# Patient Record
Sex: Male | Born: 1960
Health system: Southern US, Community
[De-identification: ages and names within clinical notes are randomized; demographics above are authoritative.]

## PROBLEM LIST (undated history)

## (undated) DIAGNOSIS — F32A Depression, unspecified: Secondary | ICD-10-CM

## (undated) DIAGNOSIS — E291 Testicular hypofunction: Secondary | ICD-10-CM

## (undated) DIAGNOSIS — F419 Anxiety disorder, unspecified: Secondary | ICD-10-CM

## (undated) DIAGNOSIS — N401 Enlarged prostate with lower urinary tract symptoms: Secondary | ICD-10-CM

## (undated) DIAGNOSIS — R3912 Poor urinary stream: Secondary | ICD-10-CM

## (undated) DIAGNOSIS — R1011 Right upper quadrant pain: Secondary | ICD-10-CM

## (undated) DIAGNOSIS — IMO0001 Reserved for inherently not codable concepts without codable children: Secondary | ICD-10-CM

## (undated) DIAGNOSIS — N138 Other obstructive and reflux uropathy: Secondary | ICD-10-CM

## (undated) DIAGNOSIS — E039 Hypothyroidism, unspecified: Secondary | ICD-10-CM

## (undated) DIAGNOSIS — M549 Dorsalgia, unspecified: Secondary | ICD-10-CM

## (undated) DIAGNOSIS — N281 Cyst of kidney, acquired: Secondary | ICD-10-CM

## (undated) DIAGNOSIS — Z0000 Encounter for general adult medical examination without abnormal findings: Secondary | ICD-10-CM

## (undated) DIAGNOSIS — F329 Major depressive disorder, single episode, unspecified: Secondary | ICD-10-CM

## (undated) DIAGNOSIS — E663 Overweight: Secondary | ICD-10-CM

## (undated) DIAGNOSIS — K219 Gastro-esophageal reflux disease without esophagitis: Secondary | ICD-10-CM

## (undated) DIAGNOSIS — G4730 Sleep apnea, unspecified: Secondary | ICD-10-CM

## (undated) DIAGNOSIS — N400 Enlarged prostate without lower urinary tract symptoms: Secondary | ICD-10-CM

## (undated) DIAGNOSIS — N2889 Other specified disorders of kidney and ureter: Secondary | ICD-10-CM

## (undated) HISTORY — DX: Anxiety disorder, unspecified: F41.9

## (undated) HISTORY — DX: Depression, unspecified: F32.A

## (undated) HISTORY — DX: Right upper quadrant pain: R10.11

## (undated) HISTORY — DX: Benign prostatic hyperplasia without lower urinary tract symptoms: N40.0

## (undated) HISTORY — DX: Overweight: E66.3

## (undated) HISTORY — DX: Poor urinary stream: R39.12

## (undated) HISTORY — DX: Hypothyroidism, unspecified: E03.9

## (undated) HISTORY — DX: Benign prostatic hyperplasia with lower urinary tract symptoms: N40.1

## (undated) HISTORY — DX: Reserved for inherently not codable concepts without codable children: IMO0001

## (undated) HISTORY — DX: Cyst of kidney, acquired: N28.1

## (undated) HISTORY — DX: Other obstructive and reflux uropathy: N13.8

## (undated) HISTORY — DX: Major depressive disorder, single episode, unspecified: F32.9

## (undated) HISTORY — DX: Gastro-esophageal reflux disease without esophagitis: K21.9

## (undated) HISTORY — DX: Testicular hypofunction: E29.1

## (undated) HISTORY — DX: Encounter for general adult medical examination without abnormal findings: Z00.00

## (undated) HISTORY — DX: Other specified disorders of kidney and ureter: N28.89

## (undated) HISTORY — DX: Sleep apnea, unspecified: G47.30

## (undated) HISTORY — DX: Dorsalgia, unspecified: M54.9

---

## 2004-09-25 ENCOUNTER — Ambulatory Visit: Payer: Self-pay | Admitting: Family Medicine

## 2005-01-28 ENCOUNTER — Ambulatory Visit: Payer: Self-pay | Admitting: Vascular Surgery

## 2008-02-24 ENCOUNTER — Emergency Department: Payer: Self-pay | Admitting: Emergency Medicine

## 2008-02-24 ENCOUNTER — Other Ambulatory Visit: Payer: Self-pay

## 2008-06-27 DIAGNOSIS — D239 Other benign neoplasm of skin, unspecified: Secondary | ICD-10-CM

## 2008-06-27 HISTORY — DX: Other benign neoplasm of skin, unspecified: D23.9

## 2009-04-29 ENCOUNTER — Ambulatory Visit: Payer: Self-pay | Admitting: General Practice

## 2012-07-19 HISTORY — PX: COLONOSCOPY: SHX174

## 2013-04-17 ENCOUNTER — Ambulatory Visit: Payer: Self-pay | Admitting: Family Medicine

## 2013-05-28 ENCOUNTER — Ambulatory Visit: Payer: Self-pay | Admitting: Gastroenterology

## 2013-08-13 ENCOUNTER — Emergency Department: Payer: Self-pay | Admitting: Emergency Medicine

## 2013-08-13 LAB — URINALYSIS, COMPLETE
BILIRUBIN, UR: NEGATIVE
Bacteria: NONE SEEN
Glucose,UR: NEGATIVE mg/dL (ref 0–75)
Ketone: NEGATIVE
Leukocyte Esterase: NEGATIVE
Nitrite: NEGATIVE
PH: 5 (ref 4.5–8.0)
PROTEIN: NEGATIVE
SQUAMOUS EPITHELIAL: NONE SEEN
Specific Gravity: 1.011 (ref 1.003–1.030)

## 2013-08-13 LAB — COMPREHENSIVE METABOLIC PANEL
ANION GAP: 3 — AB (ref 7–16)
Albumin: 4.2 g/dL (ref 3.4–5.0)
Alkaline Phosphatase: 102 U/L
BUN: 15 mg/dL (ref 7–18)
Bilirubin,Total: 0.5 mg/dL (ref 0.2–1.0)
CALCIUM: 9.2 mg/dL (ref 8.5–10.1)
CO2: 28 mmol/L (ref 21–32)
Chloride: 104 mmol/L (ref 98–107)
Creatinine: 1.08 mg/dL (ref 0.60–1.30)
EGFR (African American): >60
EGFR (Non-African Amer.): >60
Glucose: 98 mg/dL (ref 65–99)
OSMOLALITY: 271 (ref 275–301)
POTASSIUM: 4.1 mmol/L (ref 3.5–5.1)
SGOT(AST): 28 U/L (ref 15–37)
SGPT (ALT): 33 U/L (ref 12–78)
Sodium: 135 mmol/L — ABNORMAL LOW (ref 136–145)
Total Protein: 7.6 g/dL (ref 6.4–8.2)

## 2013-08-13 LAB — CBC WITH DIFFERENTIAL/PLATELET
BASOS ABS: 0 10*3/uL (ref 0.0–0.1)
Basophil %: 0.4 %
EOS ABS: 0.3 10*3/uL (ref 0.0–0.7)
Eosinophil %: 3.1 %
HCT: 44.4 % (ref 40.0–52.0)
HGB: 14.7 g/dL (ref 13.0–18.0)
LYMPHS PCT: 34 %
Lymphocyte #: 3.2 10*3/uL (ref 1.0–3.6)
MCH: 29.8 pg (ref 26.0–34.0)
MCHC: 33.2 g/dL (ref 32.0–36.0)
MCV: 90 fL (ref 80–100)
MONO ABS: 0.7 x10 3/mm (ref 0.2–1.0)
Monocyte %: 7.6 %
Neutrophil #: 5.1 10*3/uL (ref 1.4–6.5)
Neutrophil %: 54.9 %
Platelet: 237 10*3/uL (ref 150–440)
RBC: 4.95 10*6/uL (ref 4.40–5.90)
RDW: 13.3 % (ref 11.5–14.5)
WBC: 9.3 10*3/uL (ref 3.8–10.6)

## 2013-08-13 LAB — LIPASE, BLOOD: LIPASE: 141 U/L (ref 73–393)

## 2013-08-29 ENCOUNTER — Ambulatory Visit: Payer: Self-pay | Admitting: Gastroenterology

## 2013-08-29 LAB — KOH PREP

## 2013-08-31 LAB — PATHOLOGY REPORT

## 2013-09-17 DIAGNOSIS — R1011 Right upper quadrant pain: Secondary | ICD-10-CM | POA: Insufficient documentation

## 2013-09-17 DIAGNOSIS — N281 Cyst of kidney, acquired: Secondary | ICD-10-CM

## 2013-09-17 DIAGNOSIS — N2889 Other specified disorders of kidney and ureter: Secondary | ICD-10-CM

## 2013-09-17 HISTORY — DX: Other specified disorders of kidney and ureter: N28.89

## 2013-09-17 HISTORY — DX: Right upper quadrant pain: R10.11

## 2013-09-17 HISTORY — DX: Cyst of kidney, acquired: N28.1

## 2013-10-18 ENCOUNTER — Ambulatory Visit: Payer: Self-pay | Admitting: Urology

## 2014-01-24 ENCOUNTER — Ambulatory Visit: Payer: Self-pay

## 2014-05-27 ENCOUNTER — Ambulatory Visit: Payer: Self-pay

## 2014-05-27 LAB — URINALYSIS, COMPLETE
Bacteria: NEGATIVE
Bilirubin,UR: NEGATIVE
Blood: NEGATIVE
GLUCOSE, UR: NEGATIVE
Ketone: NEGATIVE
Leukocyte Esterase: NEGATIVE
Nitrite: NEGATIVE
PROTEIN: NEGATIVE
Ph: 6.5 (ref 5.0–8.0)
Specific Gravity: 1.01 (ref 1.000–1.030)
WBC UR: NONE SEEN /HPF (ref 0–5)

## 2015-02-14 ENCOUNTER — Other Ambulatory Visit: Payer: Self-pay | Admitting: Urology

## 2015-02-21 ENCOUNTER — Other Ambulatory Visit: Payer: Self-pay | Admitting: Family Medicine

## 2015-02-21 ENCOUNTER — Other Ambulatory Visit: Payer: Self-pay | Admitting: Urology

## 2015-02-21 DIAGNOSIS — R972 Elevated prostate specific antigen [PSA]: Secondary | ICD-10-CM

## 2015-02-21 DIAGNOSIS — E291 Testicular hypofunction: Secondary | ICD-10-CM

## 2015-02-21 NOTE — Telephone Encounter (Signed)
See below

## 2015-02-21 NOTE — Telephone Encounter (Signed)
Routing to provider  

## 2015-02-21 NOTE — Telephone Encounter (Signed)
Patient needs recheck in September. Will not refill now unless he's out. Year supply given in September

## 2015-02-21 NOTE — Telephone Encounter (Signed)
Pt called stating he needs refill on his thyroid medication sent to [facility] on [address]. Thanks.

## 2015-02-24 MED ORDER — LEVOTHYROXINE SODIUM 88 MCG PO TABS: 88.0000 ug | ORAL_TABLET | Freq: Every day | ORAL | Status: DC

## 2015-02-24 NOTE — Telephone Encounter (Signed)
Left message for patient

## 2015-03-03 NOTE — Addendum Note (Signed)
Addended by: Toniann Fail C on: 03/03/2015 11:00 AM   Modules accepted: Orders

## 2015-03-03 NOTE — Telephone Encounter (Signed)
Spoke with pt in reference to testosterone therapy. Made pt aware for the need of labs and office visit prior to getting medication refilled. Pt voiced understanding. Pt was transferred to the front to make appts.

## 2015-03-04 ENCOUNTER — Other Ambulatory Visit: Payer: BLUE CROSS/BLUE SHIELD

## 2015-03-04 DIAGNOSIS — E291 Testicular hypofunction: Secondary | ICD-10-CM

## 2015-03-04 DIAGNOSIS — R972 Elevated prostate specific antigen [PSA]: Secondary | ICD-10-CM

## 2015-03-05 ENCOUNTER — Encounter: Payer: Self-pay | Admitting: *Deleted

## 2015-03-05 ENCOUNTER — Other Ambulatory Visit: Payer: Self-pay | Admitting: *Deleted

## 2015-03-05 LAB — PSA: Prostate Specific Ag, Serum: 0.3 ng/mL (ref 0.0–4.0)

## 2015-03-05 LAB — HEMATOCRIT: Hematocrit: 45.3 % (ref 37.5–51.0)

## 2015-03-05 LAB — TESTOSTERONE: TESTOSTERONE: 195 ng/dL — AB (ref 348–1197)

## 2015-03-13 ENCOUNTER — Ambulatory Visit (INDEPENDENT_AMBULATORY_CARE_PROVIDER_SITE_OTHER): Payer: BLUE CROSS/BLUE SHIELD | Admitting: Urology

## 2015-03-13 VITALS — BP 117/78 | HR 78 | Resp 18 | Ht 70.0 in | Wt 212.9 lb

## 2015-03-13 DIAGNOSIS — N400 Enlarged prostate without lower urinary tract symptoms: Secondary | ICD-10-CM | POA: Diagnosis not present

## 2015-03-13 DIAGNOSIS — E291 Testicular hypofunction: Secondary | ICD-10-CM

## 2015-03-13 DIAGNOSIS — N401 Enlarged prostate with lower urinary tract symptoms: Secondary | ICD-10-CM

## 2015-03-13 DIAGNOSIS — N138 Other obstructive and reflux uropathy: Secondary | ICD-10-CM

## 2015-03-13 LAB — BLADDER SCAN AMB NON-IMAGING

## 2015-03-13 MED ORDER — TESTOSTERONE 20.25 MG/1.25GM (1.62%) TD GEL: 2.0000 | Freq: Every day | TRANSDERMAL | Status: DC

## 2015-03-13 NOTE — Progress Notes (Signed)
03/13/2015 4:14 PM   Frank [name]. 1961/02/02 [id number]  Referring provider: No referring provider defined for this encounter.  Chief Complaint  Patient presents with  . Follow-up  . Hypogonadism    HPI: Frank [name] is a 54 year old white male with hypogonadism and BPH with LUTS who presents today for follow-up  Hypogonadism Patient is currently managing his hypogonadism with AndroGel 1.62% 2 pumps daily. He is satisfied with this treatment regimen. His pretreatment testosterone level was 125 ng/dL.      Androgen Deficiency in the Aging Male      03/13/15 1500       Androgen Deficiency in the Aging Male   Do you have a decrease in libido (sex drive) No     Do you have lack of energy Yes     Do you have a decrease in strength and/or endurance Yes     Have you lost height No     Have you noticed a decreased "enjoyment of life" Yes     Are you sad and/or grumpy No     Are your erections less strong Yes     Have you noticed a recent deterioration in your ability to play sports Yes     Are you falling asleep after dinner No     Has there been a recent deterioration in your work performance Yes        BPH WITH LUTS His IPSS score today is 7, which is mild lower urinary tract symptomatology. He is mostly satisfied with his quality life due to his urinary symptoms. His PVR is 26 mL.    His major complaint today nocturia x 2.  He has had these symptoms for many years.  It is not bothersome to him at this time.  He denies any dysuria, hematuria or suprapubic pain.   He currently taking tamsulosin.  Previous PSA's:       0.2 ng/mL on 05/30/2014  He also denies any recent fevers, chills, nausea or vomiting.  He does not have a family history of PCa.      IPSS      03/13/15 1500       International Prostate Symptom Score   How often have you had the sensation of not emptying your bladder? Less than 1 in 5     How often have you had to urinate less than every  two hours? Less than 1 in 5 times     How often have you found you stopped and started again several times when you urinated? Less than 1 in 5 times     How often have you found it difficult to postpone urination? Less than 1 in 5 times     How often have you had a weak urinary stream? Less than 1 in 5 times     How often have you had to strain to start urination? Not at All     How many times did you typically get up at night to urinate? 2 Times     Total IPSS Score 7     Quality of Life due to urinary symptoms   If you were to spend the rest of your life with your urinary condition just the way it is now how would you feel about that? Mostly Satisfied        Score:  1-7 Mild 8-19 Moderate 20-35 Severe     PMH: Past Medical History  Diagnosis Date  . Hypothyroidism   .  Testicular hypofunction   . Sleep apnea   . Acid reflux   . Anxiety and depression   . Hypogonadism in male   . Weak urinary stream   . BPH (benign prostatic hyperplasia)   . Over weight   . Frequency     Surgical History: No past surgical history on file.  Home Medications:    Medication List       This list is accurate as of: 03/13/15 11:59 PM.  Always use your most recent med list.               levothyroxine 88 MCG tablet  Commonly known as:  SYNTHROID, LEVOTHROID  Take 1 tablet (88 mcg total) by mouth daily before breakfast. Due for labs around Sept 10th     sertraline 100 MG tablet  Commonly known as:  ZOLOFT  Take 50 mg by mouth.     tamsulosin 0.4 MG Caps capsule  Commonly known as:  FLOMAX  Take 0.4 mg by mouth daily.     Testosterone 20.25 MG/1.25GM (1.62%) Gel  Commonly known as:  ANDROGEL  Apply 2 Act topically daily.        Allergies: No Known Allergies  Family History: Family History  Problem Relation Age of Onset  . Heart disease      uncle  . Congestive Heart Failure Maternal Grandmother   . Cancer Paternal Grandmother     throat  . Skin cancer Maternal  Grandfather     Social History:  reports that he has never smoked. He does not have any smokeless tobacco history on file. He reports that he does not drink alcohol. His drug history is not on file.  ROS: UROLOGY Frequent Urination?: No Hard to postpone urination?: No Burning/pain with urination?: No Get up at night to urinate?: No Leakage of urine?: No Urine stream starts and stops?: No Trouble starting stream?: No Do you have to strain to urinate?: No Blood in urine?: No Urinary tract infection?: No Sexually transmitted disease?: No Injury to kidneys or bladder?: No Painful intercourse?: No Weak stream?: No Erection problems?: No Penile pain?: No  Gastrointestinal Nausea?: No Vomiting?: No Indigestion/heartburn?: No Diarrhea?: No Constipation?: No  Constitutional Fever: No Night sweats?: No Weight loss?: No Fatigue?: No  Skin Skin rash/lesions?: No Itching?: No  Eyes Blurred vision?: No Double vision?: No  Ears/Nose/Throat Sore throat?: No Sinus problems?: No  Hematologic/Lymphatic Swollen glands?: No Easy bruising?: No  Cardiovascular Leg swelling?: No Chest pain?: No  Respiratory Cough?: No Shortness of breath?: No  Endocrine Excessive thirst?: No  Musculoskeletal Back pain?: Yes Joint pain?: No  Neurological Headaches?: No Dizziness?: No  Psychologic Depression?: No Anxiety?: No  Physical Exam: BP 117/78 mmHg  Pulse 78  Resp 18  Ht 5\' 10"  (1.778 m)  Wt 212 lb 14.4 oz (96.571 kg)  BMI 30.55 kg/m2  GU: Patient with circumcised phallus.  Urethral meatus is patent.  No penile discharge. No penile lesions or rashes. Scrotum without lesions, cysts, rashes and/or edema.  Testicles are located scrotally bilaterally. No masses are appreciated in the testicles. Left and right epididymis are normal. Rectal: Patient with  normal sphincter tone. Perineum without scarring or rashes. No rectal masses are appreciated. Prostate is approximately  50 grams, no nodules are appreciated. Seminal vesicles are normal.   Laboratory Data: Lab Results  Component Value Date   WBC 9.3 08/13/2013   HGB 14.7 08/13/2013   HCT 45.3 03/04/2015   MCV 90 08/13/2013   PLT  237 08/13/2013    Lab Results  Component Value Date   CREATININE 1.08 08/13/2013    Lab Results  Component Value Date   PSA 0.3 03/04/2015    Lab Results  Component Value Date   TESTOSTERONE 195* 03/04/2015    Pertinent Imaging: Results for orders placed or performed in visit on 03/13/15  BLADDER SCAN AMB NON-IMAGING  Result Value Ref Range   Scan Result 87ml     Assessment & Plan:    1. BPH (benign prostatic hyperplasia) with LUTS:   Patient's IPSS score is 7/2.  His PVR 26 mL.  His DRE demonstrates mild enlargement, no nodules.  He will continue tamsulosin. He does not need a refill at this time.   He will follow up in 6 months for a PSA, DRE, and an IPSS.    - BLADDER SCAN AMB NON-IMAGING  2. Hypogonadism:   Patient has had good symptom control with AndroGel 1.26% 2 pumps daily.  A refill for the medication has been sent to his pharmacy.  He has been without the medication for the last few days.  He will follow-up in 6 months for blood work( morning total serum testosterone level before 9 AM and hematocrit)  and ADAM questionnaire.     Return in about 6 months (around 09/13/2015) for IPSS, ADAM and PVR.Marland Kitchen  Zara Council, [facility] [address], [place], [place] [address] [phone]

## 2015-03-17 ENCOUNTER — Telehealth: Payer: Self-pay | Admitting: Family Medicine

## 2015-03-17 NOTE — Telephone Encounter (Signed)
Routing to provider  

## 2015-03-17 NOTE — Telephone Encounter (Signed)
Pt called stated he is still experiencing pain in T6 region and right rib pain, wants to know if x rays can be ordered so that he can know what is causing the pain. Please call pt ASAP. Thanks.

## 2015-03-17 NOTE — Telephone Encounter (Signed)
Unless this patient has another chart in EPIC, I have not seen him since May 13, 2014 He would have to be seen by a provider to consider getting any xrays If this is a chronic problem, suggest that he see Dr. Wynetta Emery, our D.O., who has additional training in musculoskeletal medicine and could evaluate him and consider manipulation along with other modalities

## 2015-03-19 ENCOUNTER — Ambulatory Visit: Payer: BLUE CROSS/BLUE SHIELD

## 2015-03-19 ENCOUNTER — Encounter: Payer: Self-pay | Admitting: Emergency Medicine

## 2015-03-19 ENCOUNTER — Ambulatory Visit
Admission: EM | Admit: 2015-03-19 | Discharge: 2015-03-19 | Disposition: A | Discharge status: Home / Self Care | Payer: BLUE CROSS/BLUE SHIELD | Attending: Emergency Medicine | Admitting: Emergency Medicine

## 2015-03-19 DIAGNOSIS — S29019A Strain of muscle and tendon of unspecified wall of thorax, initial encounter: Secondary | ICD-10-CM

## 2015-03-19 DIAGNOSIS — S29012A Strain of muscle and tendon of back wall of thorax, initial encounter: Secondary | ICD-10-CM

## 2015-03-19 MED ORDER — METAXALONE 800 MG PO TABS: 800.0000 mg | ORAL_TABLET | Freq: Three times a day (TID) | ORAL | Status: DC

## 2015-03-19 MED ORDER — NAPROXEN 500 MG PO TABS: 500.0000 mg | ORAL_TABLET | Freq: Two times a day (BID) | ORAL | Status: DC

## 2015-03-19 MED ORDER — KETOROLAC TROMETHAMINE 60 MG/2ML IM SOLN
60.0000 mg | Freq: Once | INTRAMUSCULAR | Status: AC
  Administered 2015-03-19: 60 mg via INTRAMUSCULAR

## 2015-03-19 NOTE — ED Provider Notes (Signed)
CSN: [id number]     Arrival date & time 03/19/15  1408 History   First MD Initiated Contact with Patient 03/19/15 1435     Chief Complaint  Patient presents with  . Back Pain   (Consider location/radiation/quality/duration/timing/severity/associated sxs/prior Treatment) HPI  This a 54 year old gentleman who presents with 11 day history of thoracic back pain where he indicates T7-T8 level with radiation around his right rib to approximately the mid axillary line. He states that he was helping someone move furniture up a second floor twisting and at narrow stairwell in 2 days later noticed the onset of the pain. He has been in chiropractic care since he initially hurt his back in 2004 while in [place]. His chiropractor recently diagnosed him with a rib that was out of place and attempted adjustment which helped for a very short period time and next morning the pain has returned and worsened since then. He denies any shortness of breath and his O2 sats are 99% today. Is been taking Tylenol for pain and been using a heating pad at nighttime to help him sleep. Besides the rib radiation he is having no other upper extremity pain no lower extremity radiation or pain and denies any bowel or bladder dysfunction.  Past Medical History  Diagnosis Date  . Hypothyroidism   . Testicular hypofunction   . Sleep apnea   . Acid reflux   . Anxiety and depression   . Hypogonadism in male   . Weak urinary stream   . BPH (benign prostatic hyperplasia)   . Over weight   . Frequency    History reviewed. No pertinent past surgical history. Family History  Problem Relation Age of Onset  . Heart disease      uncle  . Congestive Heart Failure Maternal Grandmother   . Cancer Paternal Grandmother     throat  . Skin cancer Maternal Grandfather    Social History  Substance Use Topics  . Smoking status: Never Smoker   . Smokeless tobacco: None  . Alcohol Use: No    Review of Systems  Constitutional:  Positive for activity change.  Musculoskeletal: Positive for myalgias and back pain.  All other systems reviewed and are negative.   Allergies  Review of patient's allergies indicates no known allergies.  Home Medications   Prior to Admission medications   Medication Sig Start Date End Date Taking? Authorizing Provider  levothyroxine (SYNTHROID, LEVOTHROID) 88 MCG tablet Take 1 tablet (88 mcg total) by mouth daily before breakfast. Due for labs around Sept 10th 02/24/15  Yes Arnetha Courser, MD  sertraline (ZOLOFT) 100 MG tablet Take 50 mg by mouth. 02/15/14  Yes Historical Provider, MD  tamsulosin (FLOMAX) 0.4 MG CAPS capsule Take 0.4 mg by mouth daily.   Yes Historical Provider, MD  Testosterone (ANDROGEL) 20.25 MG/1.25GM (1.62%) GEL Apply 2 Act topically daily. 03/13/15  Yes Shannon A McGowan, PA-C  metaxalone (SKELAXIN) 800 MG tablet Take 1 tablet (800 mg total) by mouth 3 (three) times daily. 03/19/15   Lorin Picket, PA-C  naproxen (NAPROSYN) 500 MG tablet Take 1 tablet (500 mg total) by mouth 2 (two) times daily with a meal. 03/19/15   Lorin Picket, PA-C   Meds Ordered and Administered this Visit   Medications  ketorolac (TORADOL) injection 60 mg (60 mg Intramuscular Given 03/19/15 1458)    BP 113/71 mmHg  Pulse 85  Temp(Src) 98.2 F (36.8 C) (Oral)  Resp 16  Ht 5\' 10"  (1.778 m)  Wt  212 lb (96.163 kg)  BMI 30.42 kg/m2  SpO2 99% No data found.   Physical Exam  Constitutional: He is oriented to person, place, and time. He appears well-developed and well-nourished.  HENT:  Head: Normocephalic and atraumatic.  Eyes: Pupils are equal, round, and reactive to light.  Neck: Normal range of motion. Neck supple.  Pulmonary/Chest: Effort normal and breath sounds normal. No respiratory distress. He has no wheezes. He has no rales.  Musculoskeletal:  Examination of the lumbar spine thoracic spine and cervical spine good range of motion. All motion is non-full except for resting  rotation to the left. Next the tenderness in the paraspinous muscles on the right at the T7-T8 levels. Ribs are nontender.  Neurological: He is alert and oriented to person, place, and time.  Skin: Skin is warm and dry.  Psychiatric: He has a normal mood and affect. His behavior is normal. Judgment and thought content normal.  Nursing note and vitals reviewed.   ED Course  Procedures (including critical care time)  Labs Review Labs Reviewed - No data to display  Imaging Review Dg Thoracic Spine 2 View  03/19/2015   CLINICAL DATA:  54 year old male with right side T7-T8 radiculopathy. Pain following heavy lifting several weeks ago. Initial encounter.  EXAM: THORACIC SPINE 2 VIEWS  COMPARISON:  CT Abdomen and Pelvis 08/13/2013.  FINDINGS: Normal thoracic segmentation. Bone mineralization is within normal limits. Preserved thoracic vertebral height and alignment. Intermittent degenerative endplate spurring. Cervicothoracic junction alignment is within normal limits. Posterior ribs appear intact. Grossly normal visualized thoracic visceral contours.  IMPRESSION: No acute osseous abnormality identified in the thoracic spine.   Electronically Signed   By: Genevie Ann M.D.   On: 03/19/2015 15:22     Visual Acuity Review  Right Eye Distance:   Left Eye Distance:   Bilateral Distance:    Right Eye Near:   Left Eye Near:    Bilateral Near:       14:58 Medication Given LW  ketorolac (TORADOL) injection 60 mg - Dose: 60 mg ; Route: Intramuscular ; Site: Left Upper Outer Quadrant ; Scheduled Time: 1500     Patient states that after the injection his pain level went from a 4 out of 10 to less than 1 out of 10 the time of discharge.   MDM   1. Strain of thoracic spine, initial encounter    New Prescriptions   METAXALONE (SKELAXIN) 800 MG TABLET    Take 1 tablet (800 mg total) by mouth 3 (three) times daily.   NAPROXEN (NAPROSYN) 500 MG TABLET    Take 1 tablet (500 mg total) by mouth 2 (two)  times daily with a meal.  Plan: 1. Test/x-ray results and diagnosis reviewed with patient 2. rx as per orders; risks, benefits, potential side effects reviewed with patient 3. Recommend supportive treatment with heat/ice. Patient cautioned re: sleeping with a heating pad to avoid burns. 4. F/u PCP for continuing treatment.     Lorin Picket, PA-C 03/19/15 [phone]

## 2015-03-19 NOTE — ED Notes (Signed)
Pt states "I was having mid back pain last week and seen chiropractor a couple of times with no relief. I have a pulling and burning sensation from mid-back that radiates around to my right ribs."

## 2015-03-19 NOTE — Discharge Instructions (Signed)
Thoracic Strain You have injured the muscles or tendons that attach to the upper part of your back behind your chest. This injury is called a thoracic strain, thoracic sprain, or mid-back strain.  CAUSES  The cause of thoracic strain varies. A less severe injury involves pulling a muscle or tendon without tearing it. A more severe injury involves tearing (rupturing) a muscle or tendon. With less severe injuries, there may be little loss of strength. Sometimes, there are breaks (fractures) in the bones to which the muscles are attached. These fractures are rare, unless there was a direct hit (trauma) or you have weak bones due to osteoporosis or age. Longstanding strains may be caused by overuse or improper form during certain movements. Obesity can also increase your risk for back injuries. Sudden strains may occur due to injury or not warming up properly before exercise. Often, there is no obvious cause for a thoracic strain. SYMPTOMS  The main symptom is pain, especially with movement, such as during exercise. DIAGNOSIS  Your caregiver can usually tell what is wrong by taking an X-ray and doing a physical exam. TREATMENT   Physical therapy may be helpful for recovery. Your caregiver can give you exercises to do or refer you to a physical therapist after your pain improves.  After your pain improves, strengthening and conditioning programs appropriate for your sport or occupation may be helpful.  Always warm up before physical activities or athletics. Stretching after physical activity may also help.  Certain over-the-counter medicines may also help. Ask your caregiver if there are medicines that would help you. If this is your first thoracic strain injury, proper care and proper healing time before starting activities should prevent long-term problems. Torn ligaments and tendons require as long to heal as broken bones. Average healing times may be only 1 week for a mild strain. For torn muscles  and tendons, healing time may be up to 6 weeks to 2 months. HOME CARE INSTRUCTIONS   Apply ice to the injured area. Ice massages may also be used as directed.  Put ice in a plastic bag.  Place a towel between your skin and the bag.  Leave the ice on for 15-20 minutes, 03-04 times a day, for the first 2 days.  Only take over-the-counter or prescription medicines for pain, discomfort, or fever as directed by your caregiver.  Keep your appointments for physical therapy if this was prescribed.  Use wraps and back braces as instructed. SEEK IMMEDIATE MEDICAL CARE IF:   You have an increase in bruising, swelling, or pain.  Your pain has not improved with medicines.  You develop new shortness of breath, chest pain, or fever.  Problems seem to be getting worse rather than better. MAKE SURE YOU:   Understand these instructions.  Will watch your condition.  Will get help right away if you are not doing well or get worse. Document Released: 09/25/2003 Document Revised: 09/27/2011 Document Reviewed: 08/21/2010 ExitCare Patient Information 2015 [employer]. This information is not intended to replace advice given to you by your health care provider. Make sure you discuss any questions you have with your health care provider.  

## 2015-03-22 ENCOUNTER — Encounter: Payer: Self-pay | Admitting: Urology

## 2015-03-22 DIAGNOSIS — N138 Other obstructive and reflux uropathy: Secondary | ICD-10-CM | POA: Insufficient documentation

## 2015-03-22 DIAGNOSIS — E291 Testicular hypofunction: Secondary | ICD-10-CM | POA: Insufficient documentation

## 2015-03-22 DIAGNOSIS — N401 Enlarged prostate with lower urinary tract symptoms: Principal | ICD-10-CM

## 2015-03-22 HISTORY — DX: Other obstructive and reflux uropathy: N13.8

## 2015-03-31 ENCOUNTER — Encounter: Payer: Self-pay | Admitting: Family Medicine

## 2015-03-31 ENCOUNTER — Ambulatory Visit (INDEPENDENT_AMBULATORY_CARE_PROVIDER_SITE_OTHER): Payer: BLUE CROSS/BLUE SHIELD | Admitting: Family Medicine

## 2015-03-31 VITALS — BP 108/70 | HR 67 | Temp 98.5°F | Ht 68.3 in | Wt 209.0 lb

## 2015-03-31 DIAGNOSIS — M549 Dorsalgia, unspecified: Secondary | ICD-10-CM | POA: Insufficient documentation

## 2015-03-31 DIAGNOSIS — E038 Other specified hypothyroidism: Secondary | ICD-10-CM

## 2015-03-31 DIAGNOSIS — E039 Hypothyroidism, unspecified: Secondary | ICD-10-CM | POA: Insufficient documentation

## 2015-03-31 DIAGNOSIS — M546 Pain in thoracic spine: Secondary | ICD-10-CM

## 2015-03-31 DIAGNOSIS — Z0000 Encounter for general adult medical examination without abnormal findings: Secondary | ICD-10-CM | POA: Insufficient documentation

## 2015-03-31 DIAGNOSIS — E291 Testicular hypofunction: Secondary | ICD-10-CM | POA: Diagnosis not present

## 2015-03-31 HISTORY — DX: Dorsalgia, unspecified: M54.9

## 2015-03-31 HISTORY — DX: Encounter for general adult medical examination without abnormal findings: Z00.00

## 2015-03-31 NOTE — Assessment & Plan Note (Signed)
Managed by urologist; PSA just done, 0.3

## 2015-03-31 NOTE — Patient Instructions (Signed)
If you are interested in seeing a physical therapist for your back, let me know Try turmeric as a natural anti-inflammatory (for pain and arthritis). It comes in capsules where you buy aspirin and fish oil, but also as a spice where you buy pepper and garlic powder. Return for your next appointment (already scheduled)

## 2015-03-31 NOTE — Assessment & Plan Note (Signed)
Check lipids and CMP; no physical done today, just using code for labs

## 2015-03-31 NOTE — Assessment & Plan Note (Signed)
Check TSH today and adjust dose if needed 

## 2015-03-31 NOTE — Assessment & Plan Note (Addendum)
Okay to continue current meds; I'll check creatinine on the NSAID; offer PT; may continue to work with chiropractor
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
## 2015-04-01 ENCOUNTER — Other Ambulatory Visit: Payer: Self-pay | Admitting: Urology

## 2015-04-01 ENCOUNTER — Telehealth: Payer: Self-pay | Admitting: Family Medicine

## 2015-04-01 DIAGNOSIS — E038 Other specified hypothyroidism: Secondary | ICD-10-CM

## 2015-04-01 LAB — LIPID PANEL W/O CHOL/HDL RATIO
CHOLESTEROL TOTAL: 205 mg/dL — AB (ref 100–199)
HDL: 44 mg/dL (ref >39)
LDL CALC: 108 mg/dL — AB (ref 0–99)
Triglycerides: 267 mg/dL — ABNORMAL HIGH (ref 0–149)
VLDL CHOLESTEROL CAL: 53 mg/dL — AB (ref 5–40)

## 2015-04-01 LAB — COMPREHENSIVE METABOLIC PANEL
ALBUMIN: 4.3 g/dL (ref 3.5–5.5)
ALK PHOS: 69 IU/L (ref 39–117)
ALT: 17 IU/L (ref 0–44)
AST: 13 IU/L (ref 0–40)
Albumin/Globulin Ratio: 2 (ref 1.1–2.5)
BUN / CREAT RATIO: 13 (ref 9–20)
BUN: 16 mg/dL (ref 6–24)
Bilirubin Total: 0.7 mg/dL (ref 0.0–1.2)
CO2: 24 mmol/L (ref 18–29)
CREATININE: 1.23 mg/dL (ref 0.76–1.27)
Calcium: 9.3 mg/dL (ref 8.7–10.2)
Chloride: 99 mmol/L (ref 97–108)
GFR calc non Af Amer: 67 mL/min/{1.73_m2} (ref >59)
GFR, EST AFRICAN AMERICAN: 78 mL/min/{1.73_m2} (ref >59)
GLOBULIN, TOTAL: 2.2 g/dL (ref 1.5–4.5)
GLUCOSE: 94 mg/dL (ref 65–99)
Potassium: 4.5 mmol/L (ref 3.5–5.2)
SODIUM: 138 mmol/L (ref 134–144)
TOTAL PROTEIN: 6.5 g/dL (ref 6.0–8.5)

## 2015-04-01 LAB — TSH: TSH: 10.78 u[IU]/mL — AB (ref 0.450–4.500)

## 2015-04-01 MED ORDER — LEVOTHYROXINE SODIUM 100 MCG PO TABS: 100.0000 ug | ORAL_TABLET | Freq: Every day | ORAL | Status: DC

## 2015-04-01 NOTE — Telephone Encounter (Signed)
Please let patient know that he is not getting enough thyroid medicine and we will increase the dose from 88 to 100 We'll want him to have a TSH checked in about 8 weeks (just before he runs out of the 2nd bottle of medicine, with 3-4 pills to go) Glucose is normal Kidney function normal Triglycerides a little too high, but may be related to the thyroid issue and weight gain Just try to cut back on sweets, fried foods, and try to lose a little bit of weight

## 2015-04-01 NOTE — Assessment & Plan Note (Signed)
Increase dose from 88 to 100; recheck TSH in about 8 weeks

## 2015-04-02 NOTE — Telephone Encounter (Signed)
Gave patient information, he understands and will come in for bloodwork in about 8 weeks

## 2015-04-29 ENCOUNTER — Other Ambulatory Visit: Payer: Self-pay | Admitting: Urology

## 2015-05-15 ENCOUNTER — Ambulatory Visit (INDEPENDENT_AMBULATORY_CARE_PROVIDER_SITE_OTHER): Payer: BLUE CROSS/BLUE SHIELD | Admitting: Family Medicine

## 2015-05-15 ENCOUNTER — Encounter: Payer: Self-pay | Admitting: Family Medicine

## 2015-05-15 VITALS — BP 115/73 | HR 67 | Temp 97.6°F | Ht 69.75 in | Wt 207.4 lb

## 2015-05-15 DIAGNOSIS — Z23 Encounter for immunization: Secondary | ICD-10-CM | POA: Diagnosis not present

## 2015-05-15 DIAGNOSIS — E663 Overweight: Secondary | ICD-10-CM

## 2015-05-15 DIAGNOSIS — E038 Other specified hypothyroidism: Secondary | ICD-10-CM

## 2015-05-15 DIAGNOSIS — Z0000 Encounter for general adult medical examination without abnormal findings: Secondary | ICD-10-CM

## 2015-05-15 HISTORY — DX: Overweight: E66.3

## 2015-05-15 NOTE — Assessment & Plan Note (Signed)
Check TSH today

## 2015-05-15 NOTE — Assessment & Plan Note (Addendum)
Offer HIV, Hep C today; USPSTF grade A and B recommendations reviewed with patient; age-appropriate recommendations, preventive care, screening tests, etc discussed and encouraged; healthy living encouraged; see AVS for patient education given to patient

## 2015-05-15 NOTE — Patient Instructions (Addendum)
I'll recommend a baby 81 mg coated aspirin daily for cardioprotection Try to limit saturated fats in your diet (bologna, hot dogs, barbeque, cheeseburgers, hamburgers, steak, bacon, sausage, cheese, etc.) and get more fresh fruits, vegetables, and whole grains  Diet goal: try to limit or cut out fast food burgers  You received the flu shot today; it should protect you against the flu virus over the coming months; it will take about two weeks for antibodies to develop; do try to stay away from hospitals, nursing homes, and daycares during peak flu season; taking 1000 mg of vitamin C daily during flu season may help you avoid getting sick   Health Maintenance, Male A healthy lifestyle and preventative care can promote health and wellness.  Maintain regular health, dental, and eye exams.  Eat a healthy diet. Foods like vegetables, fruits, whole grains, low-fat dairy products, and lean protein foods contain the nutrients you need and are low in calories. Decrease your intake of foods high in solid fats, added sugars, and salt. Get information about a proper diet from your health care provider, if necessary.  Regular physical exercise is one of the most important things you can do for your health. Most adults should get at least 150 minutes of moderate-intensity exercise (any activity that increases your heart rate and causes you to sweat) each week. In addition, most adults need muscle-strengthening exercises on 2 or more days a week.   Maintain a healthy weight. The body mass index (BMI) is a screening tool to identify possible weight problems. It provides an estimate of body fat based on height and weight. Your health care provider can find your BMI and can help you achieve or maintain a healthy weight. For males 20 years and older:  A BMI below 18.5 is considered underweight.  A BMI of 18.5 to 24.9 is normal.  A BMI of 25 to 29.9 is considered overweight.  A BMI of 30 and above is considered  obese.  Maintain normal blood lipids and cholesterol by exercising and minimizing your intake of saturated fat. Eat a balanced diet with plenty of fruits and vegetables. Blood tests for lipids and cholesterol should begin at age 18 and be repeated every 5 years. If your lipid or cholesterol levels are high, you are over age 13, or you are at high risk for heart disease, you may need your cholesterol levels checked more frequently.Ongoing high lipid and cholesterol levels should be treated with medicines if diet and exercise are not working.  If you smoke, find out from your health care provider how to quit. If you do not use tobacco, do not start.  Lung cancer screening is recommended for adults aged 74-80 years who are at high risk for developing lung cancer because of a history of smoking. A yearly low-dose CT scan of the lungs is recommended for people who have at least a 30-pack-year history of smoking and are current smokers or have quit within the past 15 years. A pack year of smoking is smoking an average of 1 pack of cigarettes a day for 1 year (for example, a 30-pack-year history of smoking could mean smoking 1 pack a day for 30 years or 2 packs a day for 15 years). Yearly screening should continue until the smoker has stopped smoking for at least 15 years. Yearly screening should be stopped for people who develop a health problem that would prevent them from having lung cancer treatment.  If you choose to drink alcohol, do  not have more than 2 drinks per day. One drink is considered to be 12 oz (360 mL) of beer, 5 oz (150 mL) of wine, or 1.5 oz (45 mL) of liquor.  Avoid the use of street drugs. Do not share needles with anyone. Ask for help if you need support or instructions about stopping the use of drugs.  High blood pressure causes heart disease and increases the risk of stroke. High blood pressure is more likely to develop in:  People who have blood pressure in the end of the normal  range (100-139/85-89 mm Hg).  People who are overweight or obese.  People who are African American.  If you are 85-71 years of age, have your blood pressure checked every 3-5 years. If you are 23 years of age or older, have your blood pressure checked every year. You should have your blood pressure measured twice--once when you are at a hospital or clinic, and once when you are not at a hospital or clinic. Record the average of the two measurements. To check your blood pressure when you are not at a hospital or clinic, you can use:  An automated blood pressure machine at a pharmacy.  A home blood pressure monitor.  If you are 51-80 years old, ask your health care provider if you should take aspirin to prevent heart disease.  Diabetes screening involves taking a blood sample to check your fasting blood sugar level. This should be done once every 3 years after age 91 if you are at a normal weight and without risk factors for diabetes. Testing should be considered at a younger age or be carried out more frequently if you are overweight and have at least 1 risk factor for diabetes.  Colorectal cancer can be detected and often prevented. Most routine colorectal cancer screening begins at the age of 55 and continues through age 72. However, your health care provider may recommend screening at an earlier age if you have risk factors for colon cancer. On a yearly basis, your health care provider may provide home test kits to check for hidden blood in the stool. A small camera at the end of a tube may be used to directly examine the colon (sigmoidoscopy or colonoscopy) to detect the earliest forms of colorectal cancer. Talk to your health care provider about this at age 67 when routine screening begins. A direct exam of the colon should be repeated every 5-10 years through age 93, unless early forms of precancerous polyps or small growths are found.  People who are at an increased risk for hepatitis B  should be screened for this virus. You are considered at high risk for hepatitis B if:  You were born in a country where hepatitis B occurs often. Talk with your health care provider about which countries are considered high risk.  Your parents were born in a high-risk country and you have not received a shot to protect against hepatitis B (hepatitis B vaccine).  You have HIV or AIDS.  You use needles to inject street drugs.  You live with, or have sex with, someone who has hepatitis B.  You are a man who has sex with other men (MSM).  You get hemodialysis treatment.  You take certain medicines for conditions like cancer, organ transplantation, and autoimmune conditions.  Hepatitis C blood testing is recommended for all people born from [birth year] through 1963 and any individual with known risk factors for hepatitis C.  Healthy men should no longer receive  prostate-specific antigen (PSA) blood tests as part of routine cancer screening. Talk to your health care provider about prostate cancer screening.  Testicular cancer screening is not recommended for adolescents or adult males who have no symptoms. Screening includes self-exam, a health care provider exam, and other screening tests. Consult with your health care provider about any symptoms you have or any concerns you have about testicular cancer.  Practice safe sex. Use condoms and avoid high-risk sexual practices to reduce the spread of sexually transmitted infections (STIs).  You should be screened for STIs, including gonorrhea and chlamydia if:  You are sexually active and are younger than 24 years.  You are older than 24 years, and your health care provider tells you that you are at risk for this type of infection.  Your sexual activity has changed since you were last screened, and you are at an increased risk for chlamydia or gonorrhea. Ask your health care provider if you are at risk.  If you are at risk of being infected with  HIV, it is recommended that you take a prescription medicine daily to prevent HIV infection. This is called pre-exposure prophylaxis (PrEP). You are considered at risk if:  You are a man who has sex with other men (MSM).  You are a heterosexual man who is sexually active with multiple partners.  You take drugs by injection.  You are sexually active with a partner who has HIV.  Talk with your health care provider about whether you are at high risk of being infected with HIV. If you choose to begin PrEP, you should first be tested for HIV. You should then be tested every 3 months for as long as you are taking PrEP.  Use sunscreen. Apply sunscreen liberally and repeatedly throughout the day. You should seek shade when your shadow is shorter than you. Protect yourself by wearing long sleeves, pants, a wide-brimmed hat, and sunglasses year round whenever you are outdoors.  Tell your health care provider of new moles or changes in moles, especially if there is a change in shape or color. Also, tell your health care provider if a mole is larger than the size of a pencil eraser.  A one-time screening for abdominal aortic aneurysm (AAA) and surgical repair of large AAAs by ultrasound is recommended for men aged 15-75 years who are current or former smokers.  Stay current with your vaccines (immunizations).   This information is not intended to replace advice given to you by your health care provider. Make sure you discuss any questions you have with your health care provider.   Document Released: 01/01/2008 Document Revised: 07/26/2014 Document Reviewed: 11/30/2010 Elsevier Interactive Patient Education [employer].

## 2015-05-15 NOTE — Progress Notes (Signed)
Patient ID: Frank [name]., male   DOB: 1960/10/14, 54 y.o.   MRN: [id number]  Subjective:   Frank [name]. is a 54 y.o. male here for a complete physical exam  Interim issues since last visit:  Due for TSH today  USPSTF grade A and B recommendations Alcohol: no Depression: Depression screen [facility] 2/9 05/15/2015  Decreased Interest 0  Down, Depressed, Hopeless 0  PHQ - 2 Score 0    Hypertension: controlled Obesity: a little weight loss, eating better Tobacco use: no HIV, hep B, hep C: offered STD testing and prevention (chl/gon/syphilis):  Lipids: Sept Glucose: already done Colorectal cancer: 2014, 10 year pass Breast cancer: no lumps; no fam hx of breast cancer Lung cancer: n/a Osteoporosis: n/a AAA: n/a Aspirin: no ulcers, no bleeding problems,  Diet: typical american eater, but trying to do a little better Exercise: not active in sports or regular work-outs, but active in yard; job 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 Hypothyroidism   . Testicular hypofunction   . Sleep apnea   . Acid reflux   . Anxiety and depression   . Hypogonadism in male   . Weak urinary stream   . BPH (benign prostatic hyperplasia)   . Over weight   . Frequency    History reviewed. No pertinent past surgical history. Family History  Problem Relation Age of Onset  . Heart disease      uncle  . Congestive Heart Failure Maternal Grandmother   . Cancer Paternal Grandmother     throat  . Skin cancer Maternal Grandfather   . Stroke Mother    Social History  Substance Use Topics  . Smoking status: Never Smoker   . Smokeless tobacco: Former Systems developer    Quit date: 03/30/1985  . Alcohol Use: No   Review of Systems  Constitutional: Negative for unexpected weight change.  HENT: Positive for hearing loss [employer] [facility], not new).   Eyes: Negative for visual disturbance.  Cardiovascular:  Negative for chest pain.  Gastrointestinal: Negative for blood in stool.  Endocrine: Positive for heat intolerance. Negative for cold intolerance and polydipsia.  Genitourinary: Negative for hematuria, decreased urine volume and difficulty urinating.       Tamsulosin working  Musculoskeletal: Negative for joint swelling and arthralgias.  Skin:       Mole on back of neck grazed by limb and bled; no other worrisome moles  Allergic/Immunologic: Negative for food allergies.  Neurological: Negative for tremors.  Hematological: Negative for adenopathy.  Psychiatric/Behavioral: Negative for dysphoric mood.   Objective:   Filed Vitals:   05/15/15 1524  BP: 115/73  Pulse: 67  Temp: 97.6 F (36.4 C)  Height: 5' 9.75" (1.772 m)  Weight: 207 lb 6.4 oz (94.076 kg)  SpO2: 96%   Body mass index is 29.96 kg/(m^2). Wt Readings from Last 3 Encounters:  05/15/15 207 lb 6.4 oz (94.076 kg)  03/31/15 209 lb (94.802 kg)  03/19/15 212 lb (96.163 kg)   Physical Exam  Constitutional: He appears well-developed and well-nourished. No distress.  HENT:  Head: Normocephalic and atraumatic.  Nose: Nose normal.  Mouth/Throat: Oropharynx is clear and moist.  Eyes: EOM are normal. No scleral icterus.  Neck: No JVD present. No thyromegaly present.  Cardiovascular: Normal rate, regular rhythm and normal heart sounds.   Pulmonary/Chest: Effort normal and breath sounds normal. No respiratory distress.  He has no wheezes. He has no rales.  Abdominal: Soft. Bowel sounds are normal. He exhibits no distension. There is no tenderness. There is no guarding.  Musculoskeletal: Normal range of motion. He exhibits no edema.  Lymphadenopathy:    He has no cervical adenopathy.  Neurological: He is alert. He displays normal reflexes. He exhibits normal muscle tone. Coordination normal.  Skin: Skin is warm and dry. No rash noted. He is not diaphoretic. No erythema. No pallor.  Soft flesh-colored nevus about 4x4x2 mm on the  posterior neck, slight amount of dried blood, with superficial abrasion to the left; no dark pigment  Psychiatric: He has a normal mood and affect. His behavior is normal. Judgment and thought content normal.   Assessment/Plan:   Problem List Items Addressed This Visit      Endocrine   Hypothyroidism    Check TSH today        Other   Preventative health care - Primary    Offer HIV, Hep C today; USPSTF grade A and B recommendations reviewed with patient; age-appropriate recommendations, preventive care, screening tests, etc discussed and encouraged; healthy living encouraged; see AVS for patient education given to patient      Relevant Orders   HIV antibody (Completed)   Hepatitis C antibody (Completed)   Hepatitis B surface antigen (Completed)   Overweight (BMI 25.0-29.9)    Encouraged modest weight loss       Other Visit Diagnoses    Needs flu shot        flu vaccine offered and given today    Relevant Orders    Flu Vaccine QUAD 36+ mos PF IM (Fluarix & Fluzone Quad PF) (Completed)       Orders Placed This Encounter  Procedures  . Flu Vaccine QUAD 36+ mos PF IM (Fluarix & Fluzone Quad PF)  . HIV antibody  . Hepatitis C antibody  . Hepatitis B surface antigen  . TSH  . Specimen status report   Follow up plan: Return in about 1 year (around 05/14/2016) for complete physical.  An after-visit summary was printed and given to the patient at [facility].  Please see the patient instructions which may contain other information and recommendations beyond what is mentioned above in the assessment and plan.

## 2015-05-16 ENCOUNTER — Other Ambulatory Visit: Payer: Self-pay

## 2015-05-16 ENCOUNTER — Telehealth: Payer: Self-pay

## 2015-05-16 DIAGNOSIS — E038 Other specified hypothyroidism: Secondary | ICD-10-CM

## 2015-05-16 LAB — HIV ANTIBODY (ROUTINE TESTING W REFLEX): HIV Screen 4th Generation wRfx: NONREACTIVE

## 2015-05-16 LAB — HEPATITIS C ANTIBODY: Hep C Virus Ab: <0.1 s/co ratio (ref 0.0–0.9)

## 2015-05-16 LAB — HEPATITIS B SURFACE ANTIGEN: Hepatitis B Surface Ag: NEGATIVE

## 2015-05-16 NOTE — Telephone Encounter (Signed)
Called patient no answer. Left voicemail to please give me a call back.

## 2015-05-16 NOTE — Telephone Encounter (Signed)
Patient notified of result.  Lab for TSH released. Lab notified.

## 2015-05-16 NOTE — Telephone Encounter (Signed)
-----   Message from Arnetha Courser, MD sent at 05/16/2015 12:33 PM EDT ----- Let patient know HIV, hepatitis tests all negative I need the result of his thyroid test please

## 2015-05-20 LAB — TSH: TSH: 3.08 u[IU]/mL (ref 0.450–4.500)

## 2015-05-20 LAB — SPECIMEN STATUS REPORT

## 2015-05-20 NOTE — Progress Notes (Signed)
Patient notified results were normal.  Patient said he had no questions. Told to call if he had any.

## 2015-05-20 NOTE — Progress Notes (Signed)
Patient notified

## 2015-05-20 NOTE — Assessment & Plan Note (Signed)
Encouraged modest weight loss 

## 2015-05-20 NOTE — Progress Notes (Signed)
Please let patient know that his thyroid test was normal His other labs are all fine too; great news Thank you

## 2015-05-26 ENCOUNTER — Other Ambulatory Visit: Payer: Self-pay | Admitting: Family Medicine

## 2015-05-26 ENCOUNTER — Other Ambulatory Visit: Payer: Self-pay | Admitting: Obstetrics and Gynecology

## 2015-05-26 DIAGNOSIS — N400 Enlarged prostate without lower urinary tract symptoms: Secondary | ICD-10-CM

## 2015-05-26 NOTE — Telephone Encounter (Signed)
Routing to provider  

## 2015-07-01 ENCOUNTER — Other Ambulatory Visit: Payer: Self-pay | Admitting: Unknown Physician Specialty

## 2015-07-01 ENCOUNTER — Other Ambulatory Visit: Payer: Self-pay | Admitting: Urology

## 2015-07-01 NOTE — Telephone Encounter (Signed)
TSH 3.08 in late October; Rx approved

## 2015-07-01 NOTE — Telephone Encounter (Signed)
Your patient.  Thanks 

## 2015-07-10 ENCOUNTER — Telehealth: Payer: Self-pay

## 2015-07-10 DIAGNOSIS — E291 Testicular hypofunction: Secondary | ICD-10-CM

## 2015-07-10 NOTE — Telephone Encounter (Signed)
Okay to refill? 

## 2015-07-10 NOTE — Telephone Encounter (Signed)
Pt pharmacy sent a refill for testosterone androgel. Please advise.

## 2015-07-17 MED ORDER — TESTOSTERONE 20.25 MG/1.25GM (1.62%) TD GEL: 2.0000 | Freq: Every day | TRANSDERMAL | Status: DC

## 2015-07-17 NOTE — Telephone Encounter (Signed)
Refill sent to pt pharmacy 

## 2015-07-18 ENCOUNTER — Encounter: Payer: Self-pay | Admitting: *Deleted

## 2015-09-12 ENCOUNTER — Other Ambulatory Visit

## 2015-09-12 DIAGNOSIS — E291 Testicular hypofunction: Secondary | ICD-10-CM

## 2015-09-12 DIAGNOSIS — N401 Enlarged prostate with lower urinary tract symptoms: Principal | ICD-10-CM

## 2015-09-12 DIAGNOSIS — N138 Other obstructive and reflux uropathy: Secondary | ICD-10-CM

## 2015-09-13 LAB — PSA: PROSTATE SPECIFIC AG, SERUM: 0.3 ng/mL (ref 0.0–4.0)

## 2015-09-13 LAB — HEMATOCRIT: HEMATOCRIT: 41.9 % (ref 37.5–51.0)

## 2015-09-13 LAB — TESTOSTERONE: TESTOSTERONE: 216 ng/dL — AB (ref 348–1197)

## 2015-09-15 ENCOUNTER — Encounter: Payer: Self-pay | Admitting: Urology

## 2015-09-15 ENCOUNTER — Ambulatory Visit (INDEPENDENT_AMBULATORY_CARE_PROVIDER_SITE_OTHER): Payer: BLUE CROSS/BLUE SHIELD | Admitting: Urology

## 2015-09-15 VITALS — BP 107/69 | HR 75 | Ht 70.0 in | Wt 206.9 lb

## 2015-09-15 DIAGNOSIS — E291 Testicular hypofunction: Secondary | ICD-10-CM

## 2015-09-15 DIAGNOSIS — N138 Other obstructive and reflux uropathy: Secondary | ICD-10-CM

## 2015-09-15 DIAGNOSIS — N401 Enlarged prostate with lower urinary tract symptoms: Secondary | ICD-10-CM | POA: Diagnosis not present

## 2015-09-15 DIAGNOSIS — N529 Male erectile dysfunction, unspecified: Secondary | ICD-10-CM

## 2015-09-15 DIAGNOSIS — N528 Other male erectile dysfunction: Secondary | ICD-10-CM

## 2015-09-15 MED ORDER — CLOMIPHENE CITRATE 50 MG PO TABS: ORAL_TABLET | ORAL | Status: DC

## 2015-09-15 NOTE — Progress Notes (Signed)
4:40 PM   Frank [name]. [birth year] [id number]  Referring provider: Arnetha Courser, MD [place], [place] [address]  Chief Complaint  Patient presents with  . Benign Prostatic Hypertrophy    6 month follow up  . Hypogonadism    HPI: Frank [name] is a 55 year old white male with hypogonadism, erectile dysfunction and BPH with LUTS who presents today for follow-up  Hypogonadism Patient was managing was his hypogonadism with AndroGel 1.62% 2 pumps daily.  He cannot obtain the medication due to insurance issues.  His pretreatment testosterone level was 125 ng/dL.  He is complaining of a decrease in libido, lack of energy, decreased strength and endurance, his erections being less strong, a recent deterioration, a recent deterioration in his ability to play sports, falling asleep after dinner and a recent deterioration in his work performance.      Androgen Deficiency in the Aging Male      09/15/15 1600       Androgen Deficiency in the Aging Male   Do you have a decrease in libido (sex drive) Yes     Do you have lack of energy Yes     Do you have a decrease in strength and/or endurance Yes     Have you lost height No     Have you noticed a decreased "enjoyment of life" No     Are you sad and/or grumpy No     Are your erections less strong Yes     Have you noticed a recent deterioration in your ability to play sports Yes     Are you falling asleep after dinner Yes     Has there been a recent deterioration in your work performance Yes        BPH WITH LUTS His IPSS score today is 4, which is mild lower urinary tract symptomatology. He is pleased with his quality life due to his urinary symptoms.  He denies any dysuria, hematuria or suprapubic pain.  He currently taking tamsulosin.  He also denies any recent fevers, chills, nausea or vomiting.   He does not have a family history of PCa.      IPSS      09/15/15 1600       International Prostate Symptom Score   How  often have you had the sensation of not emptying your bladder? Less than 1 in 5     How often have you had to urinate less than every two hours? Less than 1 in 5 times     How often have you found you stopped and started again several times when you urinated? Less than 1 in 5 times     How often have you found it difficult to postpone urination? Not at All     How often have you had a weak urinary stream? Less than 1 in 5 times     How often have you had to strain to start urination? Not at All     How many times did you typically get up at night to urinate? None     Total IPSS Score 4     Quality of Life due to urinary symptoms   If you were to spend the rest of your life with your urinary condition just the way it is now how would you feel about that? Pleased        Score:  1-7 Mild 8-19 Moderate 20-35 Severe  Erectile dysfunction  His SHIM score is 19, which is mild ED.   He has been having difficulty with erections for the last few years.   His major complaint is achieving an erection.  His libido is diminished.   His risk factors for ED are age, BPH, hypogonadism, hypothyroidism, sleep apnea and depression .  He denies any painful erections or curvatures with his erections.         SHIM      09/15/15 1604       SHIM: Over the last 6 months:   How do you rate your confidence that you could get and keep an erection? Moderate     When you had erections with sexual stimulation, how often were your erections hard enough for penetration (entering your partner)? Most Times (much more than half the time)     During sexual intercourse, how often were you able to maintain your erection after you had penetrated (entered) your partner? Slightly Difficult     During sexual intercourse, how difficult was it to maintain your erection to completion of intercourse? Slightly Difficult     When you attempted sexual intercourse, how often was it satisfactory for you? Slightly Difficult     SHIM  Total Score   SHIM 19        Score: 1-7 Severe ED 8-11 Moderate ED 12-16 Mild-Moderate ED 17-21 Mild ED 22-25 No ED        PMH: Past Medical History  Diagnosis Date  . Hypothyroidism   . Testicular hypofunction   . Sleep apnea   . Acid reflux   . Anxiety and depression   . Hypogonadism in male   . Weak urinary stream   . BPH (benign prostatic hyperplasia)   . Over weight   . Frequency   . BPH with obstruction/lower urinary tract symptoms 03/22/2015  . Preventative health care 03/31/2015  . Back pain 03/31/2015  . Overweight (BMI 25.0-29.9) 05/15/2015  . Abdominal pain, right upper quadrant 09/17/2013  . Kidney cysts 09/17/2013  . Kidney lump 09/17/2013    Surgical History: Past Surgical History  Procedure Laterality Date  . None      Home Medications:    Medication List       This list is accurate as of: 09/15/15  4:40 PM.  Always use your most recent med list.               Testosterone 20.25 MG/1.25GM (1.62%) Gel  Commonly known as:  ANDROGEL  Apply 2 Act topically daily.     ANDROGEL PUMP 20.25 MG/ACT (1.62%) Gel  Generic drug:  Testosterone  APPLY 2 PUMPS TOPICALLY AS DIRECTED DAILY.     clomiPHENE 50 MG tablet  Commonly known as:  CLOMID  Take 1/2 tablet daily     levothyroxine 100 MCG tablet  Commonly known as:  SYNTHROID, LEVOTHROID  TAKE ONE TABLET BY MOUTH ONCE DAILY BEFORE  BREAKFAST     metaxalone 800 MG tablet  Commonly known as:  SKELAXIN  Take 1 tablet (800 mg total) by mouth 3 (three) times daily.     naproxen 500 MG tablet  Commonly known as:  NAPROSYN  Take 1 tablet (500 mg total) by mouth 2 (two) times daily with a meal.     sertraline 100 MG tablet  Commonly known as:  ZOLOFT  Take 50 mg by mouth.     tamsulosin 0.4 MG Caps capsule  Commonly known as:  FLOMAX  TAKE ONE CAPSULE BY MOUTH ONCE  DAILY        Allergies: No Known Allergies  Family History: Family History  Problem Relation Age of Onset  . Heart disease       uncle  . Congestive Heart Failure Maternal Grandmother   . Cancer Paternal Grandmother     throat  . Skin cancer Maternal Grandfather   . Stroke Mother   . Kidney disease Neg Hx   . Prostate cancer Paternal Uncle     Social History:  reports that he has never smoked. He quit smokeless tobacco use about 30 years ago. He reports that he does not drink alcohol or use illicit drugs.  ROS: UROLOGY Frequent Urination?: No Hard to postpone urination?: No Burning/pain with urination?: No Get up at night to urinate?: No Leakage of urine?: No Urine stream starts and stops?: No Trouble starting stream?: No Do you have to strain to urinate?: No Blood in urine?: No Urinary tract infection?: No Sexually transmitted disease?: No Injury to kidneys or bladder?: No Painful intercourse?: No Weak stream?: No Erection problems?: No Penile pain?: No  Gastrointestinal Nausea?: No Vomiting?: No Indigestion/heartburn?: No Diarrhea?: No Constipation?: No  Constitutional Fever: No Night sweats?: No Weight loss?: No Fatigue?: Yes  Skin Skin rash/lesions?: Yes Itching?: Yes  Eyes Blurred vision?: No Double vision?: No  Ears/Nose/Throat Sore throat?: No Sinus problems?: No  Hematologic/Lymphatic Swollen glands?: No Easy bruising?: No  Cardiovascular Leg swelling?: No Chest pain?: No  Respiratory Cough?: No Shortness of breath?: No  Endocrine Excessive thirst?: No  Musculoskeletal Back pain?: Yes Joint pain?: Yes  Neurological Headaches?: No Dizziness?: No  Psychologic Depression?: No Anxiety?: No  Physical Exam: BP 107/69 mmHg  Pulse 75  Ht 5\' 10"  (1.778 m)  Wt 206 lb 14.4 oz (93.849 kg)  BMI 29.69 kg/m2  GU: Patient with circumcised phallus.  Urethral meatus is patent.  No penile discharge. No penile lesions or rashes. Scrotum without lesions, cysts, rashes and/or edema.  Testicles are located scrotally bilaterally. No masses are appreciated in the  testicles. Left and right epididymis are normal. Rectal: Patient with  normal sphincter tone. Perineum without scarring or rashes. No rectal masses are appreciated. Prostate is approximately 50 grams, no nodules are appreciated. Seminal vesicles are normal.   Laboratory Data: Lab Results  Component Value Date   WBC 9.3 08/13/2013   HGB 14.7 08/13/2013   HCT 41.9 09/12/2015   MCV 90 08/13/2013   PLT 237 08/13/2013    Lab Results  Component Value Date   CREATININE 1.23 03/31/2015      Lab Results  Component Value Date   TESTOSTERONE 216* 09/12/2015    Pertinent Imaging: Results for orders placed or performed in visit on 09/12/15  PSA  Result Value Ref Range   Prostate Specific Ag, Serum 0.3 0.0 - 4.0 ng/mL  Testosterone  Result Value Ref Range   Testosterone 216 (L) 348 - 1197 ng/dL   Comment, Testosterone Comment   Hematocrit  Result Value Ref Range   Hematocrit 41.9 37.5 - 51.0 %  Previous PSA's:       0.2 ng/mL on 05/30/2014   Assessment & Plan:    1. BPH (benign prostatic hyperplasia) with LUTS:   Patient's IPSS score is 4/1.    His DRE demonstrates mild enlargement, no nodules.  He will continue tamsulosin. He does not need a refill at this time.   He will follow up in 6 months for a PSA, DRE, and an IPSS.     2.  Hypogonadism:   I  discussed that some men have had success using clomid for hypogonadism.  It does seem to be more successful in younger men, but there are incidences of good results in middle-aged men.  I explained that it is used in male infertility to stimulate the testicles to make more testosterone/sperm.  There has been no long term data on side effects, but some urologists has been having success with this medication. He would like to try the Clomid. I sent a prescription for Clomid 50 mg, one half tablet daily.  He will return in the morning before 9 AM for a serum testosterone draw in one month.  3. Erectile dysfunction:   SHIM score is 19.   We'll continue to monitor. He'll have a SHIM score and exam repeated in 6 months  Return in about 1 month (around 10/13/2015) for morning testosterone before 9 AM.  Zara Council, [facility] [address], [place], [place] [address] [phone]

## 2015-09-16 ENCOUNTER — Telehealth: Payer: Self-pay

## 2015-09-16 DIAGNOSIS — N529 Male erectile dysfunction, unspecified: Secondary | ICD-10-CM | POA: Insufficient documentation

## 2015-09-16 LAB — ESTRADIOL: Estradiol: 8 pg/mL (ref 7.6–42.6)

## 2015-09-16 NOTE — Telephone Encounter (Signed)
LMOM-recent labs are normal 

## 2015-09-16 NOTE — Telephone Encounter (Signed)
-----   Message from Nori Riis, PA-C sent at 09/16/2015  1:10 PM EST ----- Patient's estradiol level is normal.

## 2015-09-23 ENCOUNTER — Other Ambulatory Visit: Payer: Self-pay

## 2015-09-23 DIAGNOSIS — R110 Nausea: Secondary | ICD-10-CM | POA: Diagnosis not present

## 2015-09-23 DIAGNOSIS — M546 Pain in thoracic spine: Secondary | ICD-10-CM | POA: Insufficient documentation

## 2015-09-23 DIAGNOSIS — M549 Dorsalgia, unspecified: Secondary | ICD-10-CM | POA: Diagnosis present

## 2015-09-23 DIAGNOSIS — Z7982 Long term (current) use of aspirin: Secondary | ICD-10-CM | POA: Diagnosis not present

## 2015-09-23 DIAGNOSIS — Z79899 Other long term (current) drug therapy: Secondary | ICD-10-CM | POA: Diagnosis not present

## 2015-09-23 LAB — BASIC METABOLIC PANEL
ANION GAP: 8 (ref 5–15)
BUN: 16 mg/dL (ref 6–20)
CO2: 27 mmol/L (ref 22–32)
CREATININE: 1.26 mg/dL — AB (ref 0.61–1.24)
Calcium: 9.4 mg/dL (ref 8.9–10.3)
Chloride: 104 mmol/L (ref 101–111)
GFR calc non Af Amer: >60 mL/min (ref >60)
GLUCOSE: 140 mg/dL — AB (ref 65–99)
Potassium: 3.5 mmol/L (ref 3.5–5.1)
Sodium: 139 mmol/L (ref 135–145)

## 2015-09-23 LAB — CBC
HCT: 43.2 % (ref 40.0–52.0)
HEMOGLOBIN: 14.6 g/dL (ref 13.0–18.0)
MCH: 29.6 pg (ref 26.0–34.0)
MCHC: 33.7 g/dL (ref 32.0–36.0)
MCV: 87.9 fL (ref 80.0–100.0)
Platelets: 250 10*3/uL (ref 150–440)
RBC: 4.92 MIL/uL (ref 4.40–5.90)
RDW: 12.9 % (ref 11.5–14.5)
WBC: 10.2 10*3/uL (ref 3.8–10.6)

## 2015-09-23 LAB — TROPONIN I: Troponin I: <0.03 ng/mL (ref <0.031)

## 2015-09-23 NOTE — ED Notes (Signed)
Patient ambulatory to triage with steady gait, without difficulty or distress noted; pt reports pain left scapula x hour radiating into neck; denies hx of same; denies any known injury; denies accomp symptoms

## 2015-09-24 ENCOUNTER — Emergency Department: Payer: BLUE CROSS/BLUE SHIELD

## 2015-09-24 ENCOUNTER — Emergency Department
Admission: EM | Admit: 2015-09-24 | Discharge: 2015-09-24 | Disposition: A | Discharge status: Home / Self Care | Payer: BLUE CROSS/BLUE SHIELD | Attending: Emergency Medicine | Admitting: Emergency Medicine

## 2015-09-24 DIAGNOSIS — M546 Pain in thoracic spine: Secondary | ICD-10-CM

## 2015-09-24 LAB — TROPONIN I

## 2015-09-24 NOTE — ED Notes (Signed)
Pt alert and oriented X4, active, cooperative, pt in NAD. RR even and unlabored, color WNL.  Pt informed to return if any life threatening symptoms occur.  Ambulatory. 

## 2015-09-24 NOTE — ED Provider Notes (Signed)
[facility] Emergency Department Provider Note  ____________________________________________  Time seen: Approximately 250 AM  I have reviewed the triage vital signs and the nursing notes.   HISTORY  Chief Complaint Back Pain    HPI Frank [name]. is a 55 y.o. male who comes into the hospital today with pain in his shoulder and back. The patient reports that the pain wouldn't go away. It lasted about 2-1/2 hours. This is pain that's in his left shoulder blade that seems to be exactly opposite his left chest wall. The patient reports that the pain started around 8:30 PM. Currently the patient is in no pain. He had some nausea at home but nothing here and he has not had knee vomiting. He also denies any shortness of breath or sweats. The patient has never had this pain before. He has had heartburn and reflux in the past but this does not feel like his symptoms of reflux. The patient denies any spicy or new foods as well. The patient was concerned about the symptoms so he decided to come in to get checked out. He did not take any medication at home for the pain.   Past Medical History  Diagnosis Date  . Hypothyroidism   . Testicular hypofunction   . Sleep apnea   . Acid reflux   . Anxiety and depression   . Hypogonadism in male   . Weak urinary stream   . BPH (benign prostatic hyperplasia)   . Over weight   . Frequency   . BPH with obstruction/lower urinary tract symptoms 03/22/2015  . Preventative health care 03/31/2015  . Back pain 03/31/2015  . Overweight (BMI 25.0-29.9) 05/15/2015  . Abdominal pain, right upper quadrant 09/17/2013  . Kidney cysts 09/17/2013  . Kidney lump 09/17/2013    Patient Active Problem List   Diagnosis Date Noted  . Erectile dysfunction of organic origin 09/16/2015  . Overweight (BMI 25.0-29.9) 05/15/2015  . Preventative health care 03/31/2015  . Hypothyroidism 03/31/2015  . Back pain 03/31/2015  . BPH with obstruction/lower  urinary tract symptoms 03/22/2015  . Hypogonadism in male 03/22/2015  . Abdominal pain, right upper quadrant 09/17/2013  . Kidney cysts 09/17/2013  . Kidney lump 09/17/2013    Past Surgical History  Procedure Laterality Date  . None      Current Outpatient Rx  Name  Route  Sig  Dispense  Refill  . aspirin EC 81 MG tablet   Oral   Take 81 mg by mouth daily.         . clomiPHENE (CLOMID) 50 MG tablet      Take 1/2 tablet daily   30 tablet   3   . levothyroxine (SYNTHROID, LEVOTHROID) 100 MCG tablet      TAKE ONE TABLET BY MOUTH ONCE DAILY BEFORE  BREAKFAST   30 tablet   10   . sertraline (ZOLOFT) 100 MG tablet   Oral   Take 50 mg by mouth.         . tamsulosin (FLOMAX) 0.4 MG CAPS capsule      TAKE ONE CAPSULE BY MOUTH ONCE DAILY   30 capsule   3     Allergies Review of patient's allergies indicates no known allergies.  Family History  Problem Relation Age of Onset  . Heart disease      uncle  . Congestive Heart Failure Maternal Grandmother   . Cancer Paternal Grandmother     throat  . Skin cancer Maternal Grandfather   .  Stroke Mother   . Kidney disease Neg Hx   . Prostate cancer Paternal Uncle     Social History Social History  Substance Use Topics  . Smoking status: Never Smoker   . Smokeless tobacco: Former Systems developer    Quit date: 03/30/1985  . Alcohol Use: No    Review of Systems Constitutional: No fever/chills Eyes: No visual changes. ENT: No sore throat. Cardiovascular:  chest pain. Respiratory: Denies shortness of breath. Gastrointestinal: Nausea with No abdominal pain.  no vomiting.  No diarrhea.  No constipation. Genitourinary: Negative for dysuria. Musculoskeletal: Negative for back pain. Skin: Negative for rash. Neurological: Negative for headaches, focal weakness or numbness.  10-point ROS otherwise negative.  ____________________________________________   PHYSICAL EXAM:  VITAL SIGNS: ED Triage Vitals  Enc Vitals Group      BP 09/23/15 2316 130/71 mmHg     Pulse Rate 09/23/15 2316 70     Resp 09/23/15 2316 20     Temp 09/23/15 2316 97.7 F (36.5 C)     Temp Source 09/23/15 2316 Oral     SpO2 09/23/15 2316 99 %     Weight 09/23/15 2316 206 lb (93.441 kg)     Height 09/23/15 2316 5\' 10"  (1.778 m)     Head Cir --      Peak Flow --      Pain Score 09/23/15 2315 5     Pain Loc --      Pain Edu? --      Excl. in [place]? --     Constitutional: Alert and oriented. Well appearing and in no acute distress. Eyes: Conjunctivae are normal. PERRL. EOMI. Head: Atraumatic. Nose: No congestion/rhinnorhea. Mouth/Throat: Mucous membranes are moist.  Oropharynx non-erythematous. Cardiovascular: Normal rate, regular rhythm. Grossly normal heart sounds.  Good peripheral circulation. Respiratory: Normal respiratory effort.  No retractions. Lungs CTAB. Gastrointestinal: Soft and nontender. No distention. Positive bowel sounds Musculoskeletal: No lower extremity tenderness nor edema.  No tenderness to palpation of back including muscles between the left scapula. Neurologic:  Normal speech and language. Skin:  Skin is warm, dry and intact.  Psychiatric: Mood and affect are normal.   ____________________________________________   LABS (all labs ordered are listed, but only abnormal results are displayed)  Labs Reviewed  BASIC METABOLIC PANEL - Abnormal; Notable for the following:    Glucose, Bld 140 (*)    Creatinine, Ser 1.26 (*)    All other components within normal limits  CBC  TROPONIN I  TROPONIN I   ____________________________________________  EKG  ED ECG REPORT I, Loney Hering, the attending physician, personally viewed and interpreted this ECG.   Date: 09/24/2015  EKG Time: 2320  Rate: 65  Rhythm: normal sinus rhythm  Axis: none  Intervals:none  ST&T Change: none  ____________________________________________  RADIOLOGY  Chest x-ray: No active cardiopulmonary  disease ____________________________________________   PROCEDURES  Procedure(s) performed: None  Critical Care performed: No  ____________________________________________   INITIAL IMPRESSION / ASSESSMENT AND PLAN / ED COURSE  Pertinent labs & imaging results that were available during my care of the patient were reviewed by me and considered in my medical decision making (see chart for details).  This is a 55 year old male who comes into the hospital today with some left-sided back pain by his scapula. The patient had some cardiac enzymes and an x-ray performed. The patient's pain was fully resolved by the time he arrived in the hospital and in the hours that he's been here it did not  return. I did discuss with the patient possibilities of the cause of his pain which included esophageal spasm or aortic disease. As the patient's pain has improved I do not feel that this is aortic in nature. The patient also had 2 negative troponins and no widened mediastinum on chest x-ray. I will have patient follow back with his primary care physician for further evaluation of these symptoms. The patient and his wife agree with and understands the plan as stated. ____________________________________________   FINAL CLINICAL IMPRESSION(S) / ED DIAGNOSES  Final diagnoses:  Left-sided thoracic back pain      Loney Hering, MD 09/24/15 [phone]

## 2015-09-24 NOTE — ED Notes (Signed)
Left sided upper back pain X 1 hour PTA. Pain gone at this time. Denies SOB or other sx. Pt alert and oriented X4, active, cooperative, pt in NAD. RR even and unlabored, color WNL.

## 2015-09-24 NOTE — ED Notes (Signed)
MD at bedside. 

## 2015-09-24 NOTE — Discharge Instructions (Signed)

## 2015-09-24 NOTE — ED Notes (Signed)
Pt reports intermittent L shoulder pain that radiates into his neck. Denies chest pain or any other symptoms.

## 2015-09-24 NOTE — ED Notes (Signed)
Report from Grace, RN

## 2015-10-10 ENCOUNTER — Other Ambulatory Visit: Payer: Self-pay

## 2015-10-10 DIAGNOSIS — E291 Testicular hypofunction: Secondary | ICD-10-CM

## 2015-10-13 ENCOUNTER — Other Ambulatory Visit: Payer: BLUE CROSS/BLUE SHIELD

## 2015-10-13 DIAGNOSIS — E291 Testicular hypofunction: Secondary | ICD-10-CM

## 2015-10-14 ENCOUNTER — Telehealth: Payer: Self-pay

## 2015-10-14 DIAGNOSIS — E291 Testicular hypofunction: Secondary | ICD-10-CM

## 2015-10-14 LAB — TESTOSTERONE: TESTOSTERONE: 524 ng/dL (ref 348–1197)

## 2015-10-14 NOTE — Telephone Encounter (Signed)
LMOM

## 2015-10-14 NOTE — Telephone Encounter (Signed)
-----   Message from Nori Riis, PA-C sent at 10/14/2015  8:06 AM EDT ----- Testosterone is great.  We will need to repeat it again in 3 months with a HCT before 9AM.

## 2015-10-14 NOTE — Telephone Encounter (Signed)
Spoke with pt in reference to labs. Made aware labs need to be drawn again in 3 mo. Pt voiced understanding.

## 2015-10-27 DIAGNOSIS — R0609 Other forms of dyspnea: Secondary | ICD-10-CM | POA: Diagnosis not present

## 2015-11-25 ENCOUNTER — Other Ambulatory Visit: Payer: Self-pay | Admitting: Urology

## 2016-01-14 ENCOUNTER — Other Ambulatory Visit: Payer: Self-pay

## 2016-01-14 ENCOUNTER — Other Ambulatory Visit: Payer: BLUE CROSS/BLUE SHIELD

## 2016-01-14 DIAGNOSIS — E291 Testicular hypofunction: Secondary | ICD-10-CM

## 2016-01-15 LAB — HEMATOCRIT: Hematocrit: 44.1 % (ref 37.5–51.0)

## 2016-01-15 LAB — TESTOSTERONE: TESTOSTERONE: 516 ng/dL (ref 348–1197)

## 2016-01-28 ENCOUNTER — Other Ambulatory Visit: Payer: Self-pay

## 2016-01-28 ENCOUNTER — Ambulatory Visit (INDEPENDENT_AMBULATORY_CARE_PROVIDER_SITE_OTHER): Payer: BLUE CROSS/BLUE SHIELD | Admitting: Family Medicine

## 2016-01-28 ENCOUNTER — Ambulatory Visit: Payer: BLUE CROSS/BLUE SHIELD | Admitting: Family Medicine

## 2016-01-28 ENCOUNTER — Encounter (INDEPENDENT_AMBULATORY_CARE_PROVIDER_SITE_OTHER): Payer: Self-pay

## 2016-01-28 ENCOUNTER — Encounter: Payer: Self-pay | Admitting: Family Medicine

## 2016-01-28 VITALS — BP 106/62 | HR 86 | Temp 98.6°F | Resp 14 | Wt 207.9 lb

## 2016-01-28 DIAGNOSIS — H539 Unspecified visual disturbance: Secondary | ICD-10-CM | POA: Diagnosis not present

## 2016-01-28 DIAGNOSIS — R51 Headache: Secondary | ICD-10-CM | POA: Diagnosis not present

## 2016-01-28 DIAGNOSIS — R519 Headache, unspecified: Secondary | ICD-10-CM

## 2016-01-28 NOTE — Progress Notes (Signed)
BP 106/62   Pulse 86   Temp 98.6 F (37 C) (Oral)   Resp 14   Wt 207 lb 14.4 oz (94.3 kg)   SpO2 94%   BMI 29.83 kg/m    Subjective:    Patient ID: Frank Frank [name]., Frank [name]    DOB: 08-Mar-1961, 55 y.o.   MRN: [id number]  HPI: Frank Frank [name]. is a 55 y.o. Frank [name]  Chief Complaint  Patient presents with  . 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 neurologist in [place] They put him on topiramate and nortriptyline, but they made him feel badly; drained him They did help the headaches, but not worth the side effects Work is a trigger; stress is a big contributor He gets what he considers to be a migraine; sunlight makes it worse, photophobia and phonophobia, once the headache has started; no nausea or vomiting About two weeks ago was sitting at his desk and he cut the lights out, had a chill period at the office; while the lights were out, he saw something out of the left eye, little pinprick flashes of light, didn't move, kind of pulsating, lasted just that day, maybe one other time; that day 10-15 minutes He had a brain scan in 2007 or so; nothing abnormal I asked about stroke symptoms; he says one arm or both will go to sleep, not positional Not tested for MS He served in [place] and banged head against 50 cal machine gun, came completely out of gun turret and hit head, then fell onto his back; some of his pain is in the neck too; if his head is not hurting, his neck  hurts  Depression screen [facility] 2/9 01/28/2016 05/15/2015  Decreased Interest 0 0  Down, Depressed, Hopeless 0 0  PHQ - 2 Score 0 0   Relevant past medical, surgical, family and social history reviewed Past Medical History:  Diagnosis Date  . Abdominal pain, right upper quadrant 09/17/2013  . Acid reflux   . Anxiety and depression   . Back pain 03/31/2015  . BPH (benign prostatic hyperplasia)   . BPH with obstruction/lower urinary tract symptoms 03/22/2015  . Frequency   . Hypogonadism in Frank [name]   . Hypothyroidism   . Kidney cysts 09/17/2013  . Kidney lump 09/17/2013  . Over weight   . Overweight (BMI 25.0-29.9) 05/15/2015  . Preventative health care 03/31/2015  . Sleep apnea   . Testicular hypofunction   . Weak urinary stream    Past Surgical History:  Procedure Laterality Date  . none     Family History  Problem Relation Age of Onset  . Stroke Mother   . Cancer Mother     breast  . Migraines Mother   . Heart disease      uncle  . Congestive Heart Failure Maternal Grandmother   . Cancer Paternal Grandmother     throat  . Skin  cancer Maternal Grandfather   . Prostate cancer Paternal Uncle   . Kidney disease Neg Hx   MD note: mother used to get migraines in her 68s until her 76s; recently diagnosed with breast cancer June 2017; added to history, triple negative aggressive breast cancer, she underwent mastectomy and removed 3 lymph nodes; 2 nodes were positive (patient advised right away to check breasts regularly and let me know right away of any new lumps)  Social History  Substance Use Topics  . Smoking status: Never Smoker  . Smokeless tobacco: Former Systems developer    Quit date: 03/30/1985  . Alcohol use No   Interim medical history since last visit reviewed. Allergies and medications reviewed  Review of Systems Per HPI unless specifically indicated above     Objective:    BP 106/62   Pulse 86   Temp 98.6 F (37 C) (Oral)   Resp 14   Wt 207 lb 14.4 oz (94.3 kg)   SpO2  94%   BMI 29.83 kg/m   Wt Readings from Last 3 Encounters:  02/16/16 208 lb 9.6 oz (94.6 kg)  01/28/16 207 lb 14.4 oz (94.3 kg)  09/23/15 206 lb (93.4 kg)    Physical Exam  Constitutional: He appears well-developed and well-nourished. No distress.  HENT:  Head: Normocephalic and atraumatic.  Right Ear: External ear normal.  Left Ear: External ear normal.  Nose: Nose normal.  Mouth/Throat: Oropharynx is clear and moist.  Eyes: Conjunctivae and EOM are normal. Pupils are equal, round, and reactive to light. No scleral icterus.  Neck: No JVD present. No thyromegaly present.  Cardiovascular: Normal rate and regular rhythm.   Pulmonary/Chest: Effort normal and breath sounds normal.  Abdominal: He exhibits no distension.  Musculoskeletal: He exhibits no edema.  Neurological: He is alert. He displays normal reflexes. No cranial nerve deficit. He exhibits normal muscle tone. Coordination normal.  Skin: Skin is warm. No pallor.  Psychiatric: He has a normal mood and affect.    Results for orders placed or performed in visit on 01/14/16  Testosterone  Result Value Ref Range   Testosterone 516 348 - 1,197 ng/dL   Comment, Testosterone Comment   Hematocrit  Result Value Ref Range   Hematocrit 44.1 37.5 - 51.0 %      Assessment & Plan:   Problem List Items Addressed This Visit      Other   Frequent headaches - Primary   Relevant Orders   Ambulatory referral to Neurology   MR Brain Wo Contrast (Completed)    Other Visit Diagnoses    Visual changes       Relevant Orders   MR Brain Wo Contrast (Completed)      Follow up plan: Return if symptoms worsen or fail to improve, for headaches; request records from [facility] and neurologist in [place].  An after-visit summary was printed and given to the patient at [place].  Please see the patient instructions which may contain other information and recommendations beyond what is mentioned above in the assessment and  plan.  Orders Placed This Encounter  Procedures  . MR Brain Wo Contrast  . Ambulatory referral to Neurology  Face-to-face time with patient was more than 25 minutes, >50% time spent counseling and coordination of care

## 2016-01-28 NOTE — Patient Instructions (Addendum)
Let's get records from the [place] and from the neurologist in [place] I'll order a scan of your brain and refer you to a local neurologist If you have not heard anything from my staff in a week about any orders/referrals/studies from today, please contact us here to follow-up [phone]

## 2016-02-09 ENCOUNTER — Telehealth: Payer: Self-pay

## 2016-02-09 NOTE — Telephone Encounter (Signed)
Spoke with pt in reference to lab results. Made aware will need an office visit to have PSA and DRE. Pt voiced understanding. A front office person will call pt back with appt day and time.

## 2016-02-09 NOTE — Telephone Encounter (Signed)
-----   Message from Nori Riis, PA-C sent at 02/07/2016  4:11 PM EDT ----- Patient's testosterone and hematocrit are within normal limits. He has not had a prostate exam or PSA since February.  He will need an office visit for the PSA and rectal exam.

## 2016-02-11 ENCOUNTER — Ambulatory Visit
Admission: RE | Admit: 2016-02-11 | Discharge: 2016-02-11 | Disposition: A | Discharge status: Home / Self Care | Payer: BLUE CROSS/BLUE SHIELD | Source: Ambulatory Visit | Attending: Family Medicine | Admitting: Family Medicine

## 2016-02-11 DIAGNOSIS — R51 Headache: Secondary | ICD-10-CM | POA: Diagnosis not present

## 2016-02-11 DIAGNOSIS — H539 Unspecified visual disturbance: Secondary | ICD-10-CM | POA: Diagnosis not present

## 2016-02-15 NOTE — Progress Notes (Signed)
2:02 PM   Frank [name]. 01-07-1961 [id number]  Referring provider: Arnetha Courser, MD [address] [place], [place] [address]  Chief Complaint  Patient presents with  . Benign Prostatic Hypertrophy    6 month follow up    HPI: [name] is a 55 year old Caucasian male with hypogonadism, erectile dysfunction and BPH with LUTS who presents today for follow-up  Hypogonadism Patient is experiencing a decrease in libido, a lack of energy, a decrease in strength, erections being less strong, a recent deterioration in an ability to play sports, falling asleep after dinner and a recent deterioration in their work performance.   This is indicated by his responses to the ADAM questionnaire.  He is no longer having spontaneous erections at night.  He has sleep apnea and is not sleeping with a CPAP machine.  His current testosterone level is 516 ng/dL on 01/14/2016.  He is currently managing his hypogonadism with Clomid 50 mg, 1/2 tablet daily.        Androgen Deficiency in the Aging Male    Frank [name] Name 02/16/16 1300         Androgen Deficiency in the Aging Male   Do you have a decrease in libido (sex drive) Yes     Do you have lack of energy Yes     Do you have a decrease in strength and/or endurance Yes     Have you lost height No     Have you noticed a decreased "enjoyment of life" No     Are you sad and/or grumpy No     Are your erections less strong Yes     Have you noticed a recent deterioration in your ability to play sports Yes     Are you falling asleep after dinner Yes     Has there been a recent deterioration in your work performance Yes        BPH WITH LUTS His IPSS score today is 4, which is mild lower urinary tract symptomatology. He is pleased with his quality life due to his urinary symptoms.  His previous IPSS score was 4/2.  He denies any dysuria, hematuria or suprapubic pain.  He currently taking tamsulosin 0.4 mg daily.  He also denies any recent  fevers, chills, nausea or vomiting.   He does not have a family history of PCa.      IPSS    Row Name 02/16/16 1300         International Prostate Symptom Score   How often have you had the sensation of not emptying your bladder? Less than 1 in 5     How often have you had to urinate less than every two hours? Less than 1 in 5 times     How often have you found you stopped and started again several times when you urinated? Not at All     How often have you found it difficult to postpone urination? Less than 1 in 5 times     How often have you had a weak urinary stream? Not at All     How often have you had to strain to start urination? Not at All     How many times did you typically get up at night to urinate? 1 Time     Total IPSS Score 4       Quality of Life due to urinary symptoms   If you were to spend the rest  of your life with your urinary condition just the way it is now how would you feel about that? Pleased        Score:  1-7 Mild 8-19 Moderate 20-35 Severe  Erectile dysfunction His SHIM score is 19, which is mild ED.   His previous SHIM score was 19.  He has been having difficulty with erections for the last few years.   His major complaint is achieving an erection.  His libido is diminished.   His risk factors for ED are age, BPH, hypogonadism, hypothyroidism, sleep apnea and depression .  He denies any painful erections or curvatures with his erections.         SHIM    Row Name 02/16/16 1347         SHIM: Over the last 6 months:   How do you rate your confidence that you could get and keep an erection? Moderate     When you had erections with sexual stimulation, how often were your erections hard enough for penetration (entering your partner)? Most Times (much more than half the time)     During sexual intercourse, how often were you able to maintain your erection after you had penetrated (entered) your partner? Slightly Difficult     During sexual  intercourse, how difficult was it to maintain your erection to completion of intercourse? Slightly Difficult     When you attempted sexual intercourse, how often was it satisfactory for you? Slightly Difficult       SHIM Total Score   SHIM 19        Score: 1-7 Severe ED 8-11 Moderate ED 12-16 Mild-Moderate ED 17-21 Mild ED 22-25 No ED   PMH: Past Medical History:  Diagnosis Date  . Abdominal pain, right upper quadrant 09/17/2013  . Acid reflux   . Anxiety and depression   . Back pain 03/31/2015  . BPH (benign prostatic hyperplasia)   . BPH with obstruction/lower urinary tract symptoms 03/22/2015  . Frequency   . Hypogonadism in male   . Hypothyroidism   . Kidney cysts 09/17/2013  . Kidney lump 09/17/2013  . Over weight   . Overweight (BMI 25.0-29.9) 05/15/2015  . Preventative health care 03/31/2015  . Sleep apnea   . Testicular hypofunction   . Weak urinary stream     Surgical History: Past Surgical History:  Procedure Laterality Date  . none      Home Medications:    Medication List       Accurate as of 02/16/16  2:02 PM. Always use your most recent med list.          aspirin EC 81 MG tablet Take 81 mg by mouth daily.   clomiPHENE 50 MG tablet Commonly known as:  CLOMID Take 1/2 tablet daily   levothyroxine 100 MCG tablet Commonly known as:  SYNTHROID, LEVOTHROID TAKE ONE TABLET BY MOUTH ONCE DAILY BEFORE  BREAKFAST   sertraline 100 MG tablet Commonly known as:  ZOLOFT Take 50 mg by mouth.   tamsulosin 0.4 MG Caps capsule Commonly known as:  FLOMAX Take 1 capsule (0.4 mg total) by mouth daily.       Allergies: No Known Allergies  Family History: Family History  Problem Relation Age of Onset  . Stroke Mother   . Cancer Mother     breast  . Migraines Mother   . Heart disease      uncle  . Congestive Heart Failure Maternal Grandmother   . Cancer Paternal Grandmother  throat  . Skin cancer Maternal Grandfather   . Prostate cancer  Paternal Uncle   . Kidney disease Neg Hx     Social History:  reports that he has never smoked. He quit smokeless tobacco use about 30 years ago. He reports that he does not drink alcohol or use drugs.  ROS: UROLOGY Frequent Urination?: No Hard to postpone urination?: No Burning/pain with urination?: No Get up at night to urinate?: No Leakage of urine?: No Urine stream starts and stops?: No Trouble starting stream?: No Do you have to strain to urinate?: No Blood in urine?: No Urinary tract infection?: No Sexually transmitted disease?: No Injury to kidneys or bladder?: No Painful intercourse?: No Weak stream?: No Erection problems?: No Penile pain?: No  Gastrointestinal Nausea?: No Vomiting?: No Indigestion/heartburn?: No Diarrhea?: No Constipation?: No  Constitutional Fever: No Night sweats?: No Weight loss?: No Fatigue?: Yes  Skin Skin rash/lesions?: No Itching?: No  Eyes Blurred vision?: No Double vision?: No  Ears/Nose/Throat Sore throat?: No Sinus problems?: No  Hematologic/Lymphatic Swollen glands?: No Easy bruising?: Yes  Cardiovascular Leg swelling?: No Chest pain?: No  Respiratory Cough?: No Shortness of breath?: No  Endocrine Excessive thirst?: No  Musculoskeletal Back pain?: Yes Joint pain?: No  Neurological Headaches?: Yes Dizziness?: No  Psychologic Depression?: No Anxiety?: No  Physical Exam: BP 112/72   Pulse 67   Ht 5\' 9"  (1.753 m)   Wt 208 lb 9.6 oz (94.6 kg)   BMI 30.80 kg/m   Constitutional: Well nourished. Alert and oriented, No acute distress. HEENT: [place] AT, moist mucus membranes. Trachea midline, no masses. Cardiovascular: No clubbing, cyanosis, or edema. Respiratory: Normal respiratory effort, no increased work of breathing. GI: Abdomen is soft, non tender, non distended, no abdominal masses. Liver and spleen not palpable.  No hernias appreciated.  Stool sample for occult testing is not indicated.   GU: No  CVA tenderness.  No bladder fullness or masses.  Patient with circumcised phallus.  Urethral meatus is patent.  No penile discharge. No penile lesions or rashes. Scrotum without lesions, cysts, rashes and/or edema.  Testicles are located scrotally bilaterally. No masses are appreciated in the testicles. Left and right epididymis are normal. Rectal: Patient with  normal sphincter tone. Anus and perineum without scarring or rashes. No rectal masses are appreciated. Prostate is approximately 50 grams, no nodules are appreciated. Seminal vesicles are normal. Skin: No rashes, bruises or suspicious lesions. Lymph: No cervical or inguinal adenopathy. Neurologic: Grossly intact, no focal deficits, moving all 4 extremities. Psychiatric: Normal mood and affect.  Laboratory Data: Lab Results  Component Value Date   WBC 10.2 09/23/2015   HGB 14.6 09/23/2015   HCT 44.1 01/14/2016   MCV 87.9 09/23/2015   PLT 250 09/23/2015    Lab Results  Component Value Date   CREATININE 1.26 (H) 09/23/2015    Lab Results  Component Value Date   TESTOSTERONE 516 01/14/2016    Pertinent Imaging: Results for orders placed or performed in visit on 01/14/16  Testosterone  Result Value Ref Range   Testosterone 516 348 - 1,197 ng/dL   Comment, Testosterone Comment   Hematocrit  Result Value Ref Range   Hematocrit 44.1 37.5 - 51.0 %  Previous PSA's:       0.2 ng/mL on 05/30/2014       0.3 ng/mL on 09/12/2015   Assessment & Plan:    1. Hypogonadism:     -most recent testosterone level is 516 ng/dL on 01/14/2016  -continue  Clomid 50 mg, 1/2 tablet daily  -RTC in 6 months for HCT, testosterone, PSA, LFT's, ADAM and exam  2. BPH with LUTS  - IPSS score is 4/1, it is stable  - Continue conservative management, avoiding bladder irritants and timed voiding's  - Continue tamsulosin 0.4 mg daily  - RTC in 6 months for IPSS, PSA and exam, as testosterone therapy can cause prostate enlargement and worsen  LUTS  3. Erectile dysfunction:     -SHIM score is 19  -RTC in 6 months for SHIM score and exam, as testosterone therapy can affect erections  4. Sleep apnea:   Patient is encouraged to use CPAP machine.  Emphasized the risk of HTN, heart disease, ED and death in untreated apnea.    Return in about 6 months (around 08/18/2016) for HCT, morning testosterone (before 10AM), PSA, LFT, ADAM, IPSS, SHIM and exam.  Zara Council, PA-C  [facility] [address] [place], [place] [address] [phone]

## 2016-02-16 ENCOUNTER — Ambulatory Visit (INDEPENDENT_AMBULATORY_CARE_PROVIDER_SITE_OTHER): Payer: BLUE CROSS/BLUE SHIELD | Admitting: Urology

## 2016-02-16 ENCOUNTER — Encounter: Payer: Self-pay | Admitting: Urology

## 2016-02-16 VITALS — BP 112/72 | HR 67 | Ht 69.0 in | Wt 208.6 lb

## 2016-02-16 DIAGNOSIS — N529 Male erectile dysfunction, unspecified: Secondary | ICD-10-CM

## 2016-02-16 DIAGNOSIS — G4730 Sleep apnea, unspecified: Secondary | ICD-10-CM

## 2016-02-16 DIAGNOSIS — N400 Enlarged prostate without lower urinary tract symptoms: Secondary | ICD-10-CM | POA: Diagnosis not present

## 2016-02-16 DIAGNOSIS — N528 Other male erectile dysfunction: Secondary | ICD-10-CM | POA: Diagnosis not present

## 2016-02-16 DIAGNOSIS — E291 Testicular hypofunction: Secondary | ICD-10-CM | POA: Diagnosis not present

## 2016-02-16 DIAGNOSIS — N138 Other obstructive and reflux uropathy: Secondary | ICD-10-CM

## 2016-02-16 DIAGNOSIS — N401 Enlarged prostate with lower urinary tract symptoms: Secondary | ICD-10-CM

## 2016-02-16 MED ORDER — TAMSULOSIN HCL 0.4 MG PO CAPS: 0.4000 mg | ORAL_CAPSULE | Freq: Every day | ORAL | 4 refills | Status: DC

## 2016-02-17 ENCOUNTER — Telehealth: Payer: Self-pay | Admitting: *Deleted

## 2016-02-17 LAB — TSH: TSH: 8.5 u[IU]/mL — AB (ref 0.450–4.500)

## 2016-02-17 LAB — PSA: PROSTATE SPECIFIC AG, SERUM: 0.4 ng/mL (ref 0.0–4.0)

## 2016-02-17 NOTE — Telephone Encounter (Signed)
-----   Message from Nori Riis, PA-C sent at 02/17/2016  7:49 AM EDT ----- Please notify the patient that his thyroid labs are abnormal, but his PSA is normal. I suggested he contact his primary care physician, Dr. Robby Sermon regarding his thyroid tests.

## 2016-02-17 NOTE — Telephone Encounter (Signed)
Spoke with patient and gave results. Spoke to patient about contacting Dr. Robby Sermon about his thyroid tests. Patient ok with plan.

## 2016-03-16 DIAGNOSIS — G4733 Obstructive sleep apnea (adult) (pediatric): Secondary | ICD-10-CM | POA: Diagnosis not present

## 2016-03-16 DIAGNOSIS — M542 Cervicalgia: Secondary | ICD-10-CM | POA: Diagnosis not present

## 2016-03-16 DIAGNOSIS — M47812 Spondylosis without myelopathy or radiculopathy, cervical region: Secondary | ICD-10-CM | POA: Diagnosis not present

## 2016-03-16 DIAGNOSIS — R946 Abnormal results of thyroid function studies: Secondary | ICD-10-CM | POA: Diagnosis not present

## 2016-03-16 DIAGNOSIS — E538 Deficiency of other specified B group vitamins: Secondary | ICD-10-CM | POA: Diagnosis not present

## 2016-03-16 DIAGNOSIS — E559 Vitamin D deficiency, unspecified: Secondary | ICD-10-CM | POA: Diagnosis not present

## 2016-03-16 DIAGNOSIS — G43719 Chronic migraine without aura, intractable, without status migrainosus: Secondary | ICD-10-CM | POA: Diagnosis not present

## 2016-03-31 DIAGNOSIS — Z23 Encounter for immunization: Secondary | ICD-10-CM | POA: Diagnosis not present

## 2016-04-04 ENCOUNTER — Emergency Department
Admission: EM | Admit: 2016-04-04 | Discharge: 2016-04-04 | Disposition: A | Discharge status: Home / Self Care | Payer: BLUE CROSS/BLUE SHIELD | Attending: Emergency Medicine | Admitting: Emergency Medicine

## 2016-04-04 ENCOUNTER — Emergency Department: Payer: BLUE CROSS/BLUE SHIELD

## 2016-04-04 ENCOUNTER — Encounter: Payer: Self-pay | Admitting: Emergency Medicine

## 2016-04-04 DIAGNOSIS — Z79899 Other long term (current) drug therapy: Secondary | ICD-10-CM | POA: Diagnosis not present

## 2016-04-04 DIAGNOSIS — Y92009 Unspecified place in unspecified non-institutional (private) residence as the place of occurrence of the external cause: Secondary | ICD-10-CM | POA: Insufficient documentation

## 2016-04-04 DIAGNOSIS — Y939 Activity, unspecified: Secondary | ICD-10-CM | POA: Insufficient documentation

## 2016-04-04 DIAGNOSIS — W2209XA Striking against other stationary object, initial encounter: Secondary | ICD-10-CM | POA: Diagnosis not present

## 2016-04-04 DIAGNOSIS — S6010XA Contusion of unspecified finger with damage to nail, initial encounter: Secondary | ICD-10-CM

## 2016-04-04 DIAGNOSIS — S6000XA Contusion of unspecified finger without damage to nail, initial encounter: Secondary | ICD-10-CM

## 2016-04-04 DIAGNOSIS — E039 Hypothyroidism, unspecified: Secondary | ICD-10-CM | POA: Diagnosis not present

## 2016-04-04 DIAGNOSIS — Z87891 Personal history of nicotine dependence: Secondary | ICD-10-CM | POA: Insufficient documentation

## 2016-04-04 DIAGNOSIS — Y999 Unspecified external cause status: Secondary | ICD-10-CM | POA: Insufficient documentation

## 2016-04-04 DIAGNOSIS — M79644 Pain in right finger(s): Secondary | ICD-10-CM | POA: Diagnosis not present

## 2016-04-04 DIAGNOSIS — Z7982 Long term (current) use of aspirin: Secondary | ICD-10-CM | POA: Diagnosis not present

## 2016-04-04 DIAGNOSIS — S60021A Contusion of right index finger without damage to nail, initial encounter: Secondary | ICD-10-CM | POA: Insufficient documentation

## 2016-04-04 DIAGNOSIS — S6991XA Unspecified injury of right wrist, hand and finger(s), initial encounter: Secondary | ICD-10-CM | POA: Diagnosis present

## 2016-04-04 DIAGNOSIS — M7989 Other specified soft tissue disorders: Secondary | ICD-10-CM | POA: Diagnosis not present

## 2016-04-04 DIAGNOSIS — S60121A Contusion of right index finger with damage to nail, initial encounter: Secondary | ICD-10-CM | POA: Diagnosis not present

## 2016-04-04 MED ORDER — OXYCODONE-ACETAMINOPHEN 5-325 MG PO TABS: 1.0000 | ORAL_TABLET | Freq: Four times a day (QID) | ORAL | 0 refills | Status: DC | PRN

## 2016-04-04 MED ORDER — NAPROXEN 500 MG PO TABS: 500.0000 mg | ORAL_TABLET | Freq: Two times a day (BID) | ORAL | 0 refills | Status: DC

## 2016-04-04 MED ORDER — OXYCODONE-ACETAMINOPHEN 5-325 MG PO TABS
1.0000 | ORAL_TABLET | Freq: Once | ORAL | Status: AC
  Administered 2016-04-04: 1 via ORAL
  Filled 2016-04-04: qty 1

## 2016-04-04 MED ORDER — NAPROXEN 500 MG PO TABS
500.0000 mg | ORAL_TABLET | Freq: Once | ORAL | Status: AC
  Administered 2016-04-04: 500 mg via ORAL
  Filled 2016-04-04: qty 1

## 2016-04-04 NOTE — ED Provider Notes (Signed)
[facility] Emergency Department Provider Note   ____________________________________________   None    (approximate)  I have reviewed the triage vital signs and the nursing notes.   HISTORY  Chief Complaint Finger Injury    HPI Frank [name]. is a 55 y.o. male patient complaining of right index finger pain and edema. Patient state he slammed his finger in a front door yesterday. Patient rated his pain as a 5/10. Patient described a pain is sharp. Patient denies loss of sensation or loss of function of the affected digit. No palliative measures taken for this complaint.   Past Medical History:  Diagnosis Date  . Abdominal pain, right upper quadrant 09/17/2013  . Acid reflux   . Anxiety and depression   . Back pain 03/31/2015  . BPH (benign prostatic hyperplasia)   . BPH with obstruction/lower urinary tract symptoms 03/22/2015  . Frequency   . Hypogonadism in male   . Hypothyroidism   . Kidney cysts 09/17/2013  . Kidney lump 09/17/2013  . Over weight   . Overweight (BMI 25.0-29.9) 05/15/2015  . Preventative health care 03/31/2015  . Sleep apnea   . Testicular hypofunction   . Weak urinary stream     Patient Active Problem List   Diagnosis Date Noted  . Frequent headaches 01/28/2016  . Erectile dysfunction of organic origin 09/16/2015  . Overweight (BMI 25.0-29.9) 05/15/2015  . Preventative health care 03/31/2015  . Hypothyroidism 03/31/2015  . Back pain 03/31/2015  . BPH with obstruction/lower urinary tract symptoms 03/22/2015  . Hypogonadism in male 03/22/2015  . Abdominal pain, right upper quadrant 09/17/2013  . Kidney cysts 09/17/2013  . Kidney lump 09/17/2013    Past Surgical History:  Procedure Laterality Date  . none      Prior to Admission medications   Medication Sig Start Date End Date Taking? Authorizing Provider  aspirin EC 81 MG tablet Take 81 mg by mouth daily.    Historical Provider, MD  clomiPHENE (CLOMID) 50 MG  tablet Take 1/2 tablet daily 09/15/15   Larene Beach A McGowan, PA-C  levothyroxine (SYNTHROID, LEVOTHROID) 100 MCG tablet TAKE ONE TABLET BY MOUTH ONCE DAILY BEFORE  BREAKFAST 07/01/15   Arnetha Courser, MD  sertraline (ZOLOFT) 100 MG tablet Take 50 mg by mouth. 02/15/14   Historical Provider, MD  tamsulosin (FLOMAX) 0.4 MG CAPS capsule Take 1 capsule (0.4 mg total) by mouth daily. 02/16/16   Nori Riis, PA-C    Allergies Review of patient's allergies indicates no known allergies.  Family History  Problem Relation Age of Onset  . Stroke Mother   . Cancer Mother     breast  . Migraines Mother   . Heart disease      uncle  . Congestive Heart Failure Maternal Grandmother   . Cancer Paternal Grandmother     throat  . Skin cancer Maternal Grandfather   . Prostate cancer Paternal Uncle   . Kidney disease Neg Hx     Social History Social History  Substance Use Topics  . Smoking status: Never Smoker  . Smokeless tobacco: Former Systems developer    Quit date: 03/30/1985  . Alcohol use No    Review of Systems Constitutional: No fever/chills Eyes: No visual changes. ENT: No sore throat. Cardiovascular: Denies chest pain. Respiratory: Denies shortness of breath. Gastrointestinal: No abdominal pain.  No nausea, no vomiting.  No diarrhea.  No constipation. Genitourinary: Negative for dysuria. Musculoskeletal: Pain right index finger.  Skin: Negative for rash.  Ecchymosis under the nail of the right index finger. Neurological: Negative for headaches, focal weakness or numbness.    ____________________________________________   PHYSICAL EXAM:  VITAL SIGNS: ED Triage Vitals [04/04/16 1853]  Enc Vitals Group     BP 129/70     Pulse Rate 70     Resp 16     Temp 98 F (36.7 C)     Temp src      SpO2 95 %     Weight 207 lb (93.9 kg)     Height 5\' 10"  (1.778 m)     Head Circumference      Peak Flow      Pain Score 5     Pain Loc      Pain Edu?      Excl. in [place]?     Constitutional:  Alert and oriented. Well appearing and in no acute distress. Eyes: Conjunctivae are normal. PERRL. EOMI. Head: Atraumatic. Nose: No congestion/rhinnorhea. Mouth/Throat: Mucous membranes are moist.  Oropharynx non-erythematous. Neck: No stridor.  No cervical spine tenderness to palpation. Hematological/Lymphatic/Immunilogical: No cervical lymphadenopathy. Cardiovascular: Normal rate, regular rhythm. Grossly normal heart sounds.  Good peripheral circulation. Respiratory: Normal respiratory effort.  No retractions. Lungs CTAB. Gastrointestinal: Soft and nontender. No distention. No abdominal bruits. No CVA tenderness. Musculoskeletal: No obvious deformity to the right index finger. There is mild edema and ecchymosis under the nail bed. Patient is full nuchal range of motion of the finger.  Neurologic:  Normal speech and language. No gross focal neurologic deficits are appreciated. No gait instability. Skin:  Skin is warm, dry and intact. No rash noted. Small subungual hematoma index finger Psychiatric: Mood and affect are normal. Speech and behavior are normal.  ____________________________________________   LABS (all labs ordered are listed, but only abnormal results are displayed)  Labs Reviewed - No data to display ____________________________________________  EKG   ____________________________________________  RADIOLOGY  Acute findings x-ray of the right index finger. ____________________________________________   PROCEDURES  Procedure(s) performed: None  Procedures  Critical Care performed: No  ____________________________________________   INITIAL IMPRESSION / ASSESSMENT AND PLAN / ED COURSE  Pertinent labs & imaging results that were available during my care of the patient were reviewed by me and considered in my medical decision making (see chart for details).  Subungual hematoma right index finger secondary to contusion. Discussed negative. X-ray finding with  patient. Patient given discharge care instruction and advised to follow-up if his hematoma enlarges.  Clinical Course     ____________________________________________   FINAL CLINICAL IMPRESSION(S) / ED DIAGNOSES  Final diagnoses:  Subungual hematoma of digit of hand, initial encounter  Finger contusion, initial encounter      NEW MEDICATIONS STARTED DURING THIS VISIT:  New Prescriptions   No medications on file     Note:  This document was prepared using Dragon voice recognition software and may include unintentional dictation errors.    Sable Feil, PA-C 04/04/16 1928    Delman Kitten, MD 04/04/16 [phone]

## 2016-04-04 NOTE — ED Notes (Signed)
Esignature not working. Patient verbalizes understanding of discharge instructions and is without questions.

## 2016-04-04 NOTE — ED Triage Notes (Signed)
Slammed right index finger in front door of home yesterday.  C/O right index finger pain and swelling.

## 2016-04-05 ENCOUNTER — Other Ambulatory Visit: Payer: Self-pay | Admitting: Urology

## 2016-04-05 DIAGNOSIS — E291 Testicular hypofunction: Secondary | ICD-10-CM

## 2016-04-14 DIAGNOSIS — Z713 Dietary counseling and surveillance: Secondary | ICD-10-CM | POA: Diagnosis not present

## 2016-04-21 DIAGNOSIS — Z713 Dietary counseling and surveillance: Secondary | ICD-10-CM | POA: Diagnosis not present

## 2016-05-05 DIAGNOSIS — Z713 Dietary counseling and surveillance: Secondary | ICD-10-CM | POA: Diagnosis not present

## 2016-05-17 ENCOUNTER — Encounter: Payer: Self-pay | Admitting: Family Medicine

## 2016-05-17 ENCOUNTER — Ambulatory Visit (INDEPENDENT_AMBULATORY_CARE_PROVIDER_SITE_OTHER): Payer: BLUE CROSS/BLUE SHIELD | Admitting: Family Medicine

## 2016-05-17 DIAGNOSIS — Z0000 Encounter for general adult medical examination without abnormal findings: Secondary | ICD-10-CM | POA: Diagnosis not present

## 2016-05-17 DIAGNOSIS — G43719 Chronic migraine without aura, intractable, without status migrainosus: Secondary | ICD-10-CM | POA: Diagnosis not present

## 2016-05-17 DIAGNOSIS — G4733 Obstructive sleep apnea (adult) (pediatric): Secondary | ICD-10-CM | POA: Diagnosis not present

## 2016-05-17 MED ORDER — NYSTATIN 100000 UNIT/GM EX CREA: 1.0000 "application " | TOPICAL_CREAM | Freq: Two times a day (BID) | CUTANEOUS | 0 refills | Status: DC

## 2016-05-17 MED ORDER — HYDROCORTISONE ACETATE 25 MG RE SUPP: 25.0000 mg | Freq: Two times a day (BID) | RECTAL | 0 refills | Status: DC | PRN

## 2016-05-17 NOTE — Progress Notes (Signed)
  Patient ID: Frank Santangelo., male   DOB: 1961/03/18, 55 y.o.   MRN: [id number] Subjective:  Review of Systems Objective:  Physical Exam Assessment/Plan:

## 2016-05-17 NOTE — Assessment & Plan Note (Signed)
USPSTF grade A and B recommendations reviewed with patient; age-appropriate recommendations, preventive care, screening tests, etc discussed and encouraged; healthy living encouraged; see AVS for patient education given to patient  

## 2016-05-17 NOTE — Patient Instructions (Addendum)
We'll get labs today Use the new cream on your bottom to help with the rash Start miralax one scoop in 8 ounces of water daily Use the new rectal suppositories twice a day for 6 days to calm down the area You may repeat the treatment if needed If bleeding persists, let me know and we'll get you in to see someone

## 2016-05-17 NOTE — Progress Notes (Signed)
Patient ID: Frank [name]., male   DOB: [birth year], 55 y.o.   MRN: [id number]   Subjective:   Frank [name]. is a 55 y.o. male here for a complete physical exam  Interim issues since last visit: saw Dr. Manuella [name], neurologist today; he has bone spurs in the cervical spine  USPSTF grade A and B recommendations Alcohol: no Depression: Depression screen [facility] 2/9 05/17/2016 01/28/2016 05/15/2015  Decreased Interest 0 0 0  Down, Depressed, Hopeless 0 0 0  PHQ - 2 Score 0 0 0    Hypertension: controlled Obesity: lost 5 pounds, giving up bojangles Tobacco use: nonsmoker HIV, hep B, hep C: UTD STD testing and prevention (chl/gon/syphilis): declined Lipids: check today Glucose: check today; diet Pepsi at 2:30 pm today; 11:30 am salad with [place] dressing Colorectal cancer: done at age 55; next due in 37 years; positive for blood in stool (since changing diet; needs to get more fiber, bright red; painful and stings when it happens) and constipation.  Prostate cancer: paternal uncle Breast cancer: no lumps Lung cancer: n/a Osteoporosis: n/a AAA: n/a Aspirin: taking aspirin Diet: cut out Bojangles Exercise: more active Skin cancer: mole on the left shoulder  Thyroid was out of range in July 2017; tired; constipated; [facility]; he is now on 150 mcg daily; that changed happened about six months ago  Past Medical History:  Diagnosis Date  . Abdominal pain, right upper quadrant 09/17/2013  . Acid reflux   . Anxiety and depression   . Back pain 03/31/2015  . BPH (benign prostatic hyperplasia)   . BPH with obstruction/lower urinary tract symptoms 03/22/2015  . Frequency   . Hypogonadism in male   . Hypothyroidism   . Kidney cysts 09/17/2013  . Kidney lump 09/17/2013  . Over weight   . Overweight (BMI 25.0-29.9) 05/15/2015  . Preventative health care 03/31/2015  . Sleep apnea   . Testicular hypofunction   . Weak urinary stream    Past Surgical History:  Procedure  Laterality Date  . none     Family History  Problem Relation Age of Onset  . Stroke Mother   . Cancer Mother     breast  . Migraines Mother   . Heart disease      uncle  . Congestive Heart Failure Maternal Grandmother   . Cancer Paternal Grandmother     throat  . Skin cancer Maternal Grandfather   . Prostate cancer Paternal Uncle   . Kidney disease Neg Hx   MD note: mother is in midst of chemo; triple negative breast cancer; she is the first in the family; 53 years of age  Social History  Substance Use Topics  . Smoking status: Never Smoker  . Smokeless tobacco: Former Systems developer    Quit date: 03/30/1985  . Alcohol use No   Review of Systems  Constitutional: Negative for unexpected weight change (working for the weight loss).  HENT: Negative for hearing loss.   Eyes: Negative for visual disturbance.       Eyes get dry, has drops but rarely uses them  Respiratory: Negative for shortness of breath.   Cardiovascular: Positive for chest pain (used to panic over it; he and wife think it's gas; full feeling in chest; not sharp; like a pressure; lasts 5-10 minutes, one lasted an hour (went to ER); had stress test for w/u, within last year).  Gastrointestinal: Positive for blood in stool and constipation.  Endocrine: Negative for polydipsia and polyuria.  Genitourinary: Negative for hematuria.  Musculoskeletal: Positive for arthralgias (little bit, saw Dr. Manuella [name], neurologist).  Neurological: Positive for numbness (both arms if sleeping flat in the bed, seeing neuro). Negative for tremors.  Hematological: Does not bruise/bleed easily.  Psychiatric/Behavioral: Negative for dysphoric mood.    Objective:   Vitals:   05/17/16 1505  BP: 112/64  Pulse: 71  Resp: 16  Temp: 98.3 F (36.8 C)  TempSrc: Oral  SpO2: 96%  Weight: 203 lb 3.2 oz (92.2 kg)  Height: 5' 8.5" (1.74 m)   Body mass index is 30.45 kg/m. Wt Readings from Last 3 Encounters:  05/17/16 203 lb 3.2 oz (92.2 kg)   04/04/16 207 lb (93.9 kg)  02/16/16 208 lb 9.6 oz (94.6 kg)   Physical Exam  Constitutional: He appears well-developed and well-nourished. No distress.  HENT:  Head: Normocephalic and atraumatic.  Nose: Nose normal.  Mouth/Throat: Oropharynx is clear and moist.  Eyes: EOM are normal. No scleral icterus.  Neck: No JVD present. No thyromegaly present.  Cardiovascular: Normal rate, regular rhythm and normal heart sounds.   Pulmonary/Chest: Effort normal and breath sounds normal. No respiratory distress.  Abdominal: Soft. Bowel sounds are normal. He exhibits no distension. There is no tenderness. There is no guarding.  Genitourinary: Rectal exam shows internal hemorrhoid. Rectal exam shows no mass and anal tone normal. Prostate is not enlarged and not tender.  Musculoskeletal: Normal range of motion. He exhibits no edema.  Lymphadenopathy:    He has no cervical adenopathy.  Neurological: He is alert. He displays normal reflexes. He exhibits normal muscle tone. Coordination normal.  Skin: Skin is warm and dry. Rash (erythematous "kissing" rash between the buttocks, clear borders with slight scale) noted. He is not diaphoretic. No erythema. No pallor.  subungal hematoma right 2nd fingernail  Psychiatric: He has a normal mood and affect. His behavior is normal. Judgment and thought content normal. His mood appears not anxious. He does not exhibit a depressed mood.   Assessment/Plan:   Problem List Items Addressed This Visit      Other   Preventative health care    USPSTF grade A and B recommendations reviewed with patient; age-appropriate recommendations, preventive care, screening tests, etc discussed and encouraged; healthy living encouraged; see AVS for patient education given to patient       Relevant Orders   Lipid panel (Completed)   TSH (Completed)   CBC with Differential/Platelet (Completed)   COMPLETE METABOLIC PANEL WITH GFR (Completed)      Meds ordered this encounter   Medications  . levothyroxine (SYNTHROID, LEVOTHROID) 125 MCG tablet    Sig: Take by mouth.  . magnesium oxide (MAG-OX) 400 MG tablet    Sig: Take by mouth.  . Cholecalciferol (VITAMIN D3) 1000 units CAPS    Sig: Take by mouth.  . baclofen (LIORESAL) 10 MG tablet    Sig: Take by mouth.  . DISCONTD: FLUVIRIN SUSP    Sig: ADM 0.5ML IM UTD    Refill:  0  . nystatin cream (MYCOSTATIN)    Sig: Apply 1 application topically 2 (two) times daily.    Dispense:  30 g    Refill:  0  . hydrocortisone (ANUSOL-HC) 25 MG suppository    Sig: Place 1 suppository (25 mg total) rectally 2 (two) times daily as needed for hemorrhoids or itching.    Dispense:  24 suppository    Refill:  0   Orders Placed This Encounter  Procedures  . Lipid panel  .  TSH  . CBC with Differential/Platelet  . COMPLETE METABOLIC PANEL WITH GFR   Follow up plan: Return in about 1 year (around 05/17/2017) for complete physical.  An After Visit Summary was printed and given to the patient.

## 2016-05-18 LAB — CBC WITH DIFFERENTIAL/PLATELET
BASOS PCT: 0 %
Basophils Absolute: 0 cells/uL (ref 0–200)
EOS ABS: 172 {cells}/uL (ref 15–500)
Eosinophils Relative: 2 %
HEMATOCRIT: 45.5 % (ref 38.5–50.0)
Hemoglobin: 14.9 g/dL (ref 13.2–17.1)
LYMPHS PCT: 31 %
Lymphs Abs: 2666 cells/uL (ref 850–3900)
MCH: 29.7 pg (ref 27.0–33.0)
MCHC: 32.7 g/dL (ref 32.0–36.0)
MCV: 90.8 fL (ref 80.0–100.0)
MONO ABS: 688 {cells}/uL (ref 200–950)
MONOS PCT: 8 %
MPV: 9.4 fL (ref 7.5–12.5)
NEUTROS ABS: 5074 {cells}/uL (ref 1500–7800)
Neutrophils Relative %: 59 %
PLATELETS: 250 10*3/uL (ref 140–400)
RBC: 5.01 MIL/uL (ref 4.20–5.80)
RDW: 13.6 % (ref 11.0–15.0)
WBC: 8.6 10*3/uL (ref 3.8–10.8)

## 2016-05-18 LAB — COMPLETE METABOLIC PANEL WITH GFR
ALT: 16 U/L (ref 9–46)
AST: 18 U/L (ref 10–35)
Albumin: 4.4 g/dL (ref 3.6–5.1)
Alkaline Phosphatase: 54 U/L (ref 40–115)
BILIRUBIN TOTAL: 0.8 mg/dL (ref 0.2–1.2)
BUN: 14 mg/dL (ref 7–25)
CHLORIDE: 105 mmol/L (ref 98–110)
CO2: 25 mmol/L (ref 20–31)
CREATININE: 1.29 mg/dL (ref 0.70–1.33)
Calcium: 9.3 mg/dL (ref 8.6–10.3)
GFR, Est African American: 73 mL/min (ref >=60)
GFR, Est Non African American: 63 mL/min (ref >=60)
Glucose, Bld: 97 mg/dL (ref 65–99)
Potassium: 4.5 mmol/L (ref 3.5–5.3)
Sodium: 139 mmol/L (ref 135–146)
TOTAL PROTEIN: 6.9 g/dL (ref 6.1–8.1)

## 2016-05-18 LAB — LIPID PANEL
CHOLESTEROL: 202 mg/dL — AB (ref 125–200)
HDL: 44 mg/dL (ref >=40)
LDL Cholesterol: 135 mg/dL — ABNORMAL HIGH (ref <130)
TRIGLYCERIDES: 113 mg/dL (ref <150)
Total CHOL/HDL Ratio: 4.6 Ratio (ref <=5.0)
VLDL: 23 mg/dL (ref <30)

## 2016-05-18 LAB — TSH: TSH: 3.06 mIU/L (ref 0.40–4.50)

## 2016-05-19 DIAGNOSIS — Z713 Dietary counseling and surveillance: Secondary | ICD-10-CM | POA: Diagnosis not present

## 2016-06-02 DIAGNOSIS — Z713 Dietary counseling and surveillance: Secondary | ICD-10-CM | POA: Diagnosis not present

## 2016-06-16 DIAGNOSIS — Z713 Dietary counseling and surveillance: Secondary | ICD-10-CM | POA: Diagnosis not present

## 2016-06-30 DIAGNOSIS — Z7189 Other specified counseling: Secondary | ICD-10-CM | POA: Diagnosis not present

## 2016-07-07 DIAGNOSIS — Z713 Dietary counseling and surveillance: Secondary | ICD-10-CM | POA: Diagnosis not present

## 2016-07-26 ENCOUNTER — Telehealth: Payer: Self-pay | Admitting: Family Medicine

## 2016-07-26 MED ORDER — HYDROCORTISONE ACETATE 25 MG RE SUPP: 25.0000 mg | Freq: Two times a day (BID) | RECTAL | 0 refills | Status: DC | PRN

## 2016-07-26 NOTE — Telephone Encounter (Signed)
done

## 2016-07-26 NOTE — Telephone Encounter (Signed)
Pt was prescribed hydrocortisone suppository in October but found out that it was going to cost them $400 which then he did not get it filled. Now Patient is asking that you please call in the generic brand to [facility]

## 2016-08-10 ENCOUNTER — Other Ambulatory Visit: Payer: Self-pay

## 2016-08-10 DIAGNOSIS — N401 Enlarged prostate with lower urinary tract symptoms: Secondary | ICD-10-CM

## 2016-08-10 DIAGNOSIS — E291 Testicular hypofunction: Secondary | ICD-10-CM

## 2016-08-11 ENCOUNTER — Other Ambulatory Visit: Payer: BLUE CROSS/BLUE SHIELD

## 2016-08-11 DIAGNOSIS — N401 Enlarged prostate with lower urinary tract symptoms: Secondary | ICD-10-CM | POA: Diagnosis not present

## 2016-08-11 DIAGNOSIS — E291 Testicular hypofunction: Secondary | ICD-10-CM | POA: Diagnosis not present

## 2016-08-12 LAB — HEMATOCRIT: Hematocrit: 45.6 % (ref 37.5–51.0)

## 2016-08-12 LAB — TESTOSTERONE: Testosterone: 474 ng/dL (ref 264–916)

## 2016-08-12 LAB — HEPATIC FUNCTION PANEL
ALK PHOS: 75 IU/L (ref 39–117)
ALT: 18 IU/L (ref 0–44)
AST: 15 IU/L (ref 0–40)
Albumin: 4.1 g/dL (ref 3.5–5.5)
BILIRUBIN, DIRECT: 0.11 mg/dL (ref 0.00–0.40)
Bilirubin Total: 0.4 mg/dL (ref 0.0–1.2)
Total Protein: 6.6 g/dL (ref 6.0–8.5)

## 2016-08-12 LAB — PSA: PROSTATE SPECIFIC AG, SERUM: 0.3 ng/mL (ref 0.0–4.0)

## 2016-08-17 ENCOUNTER — Other Ambulatory Visit: Payer: Self-pay | Admitting: Family Medicine

## 2016-08-18 ENCOUNTER — Encounter: Payer: Self-pay | Admitting: Urology

## 2016-08-18 ENCOUNTER — Ambulatory Visit (INDEPENDENT_AMBULATORY_CARE_PROVIDER_SITE_OTHER): Payer: BLUE CROSS/BLUE SHIELD | Admitting: Urology

## 2016-08-18 VITALS — BP 114/68 | HR 80 | Ht 70.0 in | Wt 199.5 lb

## 2016-08-18 DIAGNOSIS — N138 Other obstructive and reflux uropathy: Secondary | ICD-10-CM

## 2016-08-18 DIAGNOSIS — Z713 Dietary counseling and surveillance: Secondary | ICD-10-CM | POA: Diagnosis not present

## 2016-08-18 DIAGNOSIS — N529 Male erectile dysfunction, unspecified: Secondary | ICD-10-CM | POA: Diagnosis not present

## 2016-08-18 DIAGNOSIS — E291 Testicular hypofunction: Secondary | ICD-10-CM

## 2016-08-18 DIAGNOSIS — N401 Enlarged prostate with lower urinary tract symptoms: Secondary | ICD-10-CM | POA: Diagnosis not present

## 2016-08-18 MED ORDER — CLOMIPHENE CITRATE 50 MG PO TABS: 25.0000 mg | ORAL_TABLET | Freq: Every day | ORAL | 3 refills | Status: DC

## 2016-08-18 MED ORDER — TAMSULOSIN HCL 0.4 MG PO CAPS: 0.4000 mg | ORAL_CAPSULE | Freq: Every day | ORAL | 3 refills | Status: DC

## 2016-08-18 NOTE — Progress Notes (Signed)
hy

## 2016-08-18 NOTE — Telephone Encounter (Signed)
Please verify thyroid dose with patient I received a request for 100 mcg strength Med list shows a strength of 125 mcg was imported into the med list on 05/17/16 at Dr. Trena Platt office His last TSH in October was normal; what was he taking prior to that blood draw? We need to know what he is supposed to be taking so I can refill the appropriate dose Thank you

## 2016-08-18 NOTE — Progress Notes (Signed)
1:42 PM   Frank [name]. 04/30/1961 [id number]  Referring provider: Arnetha Courser, MD [address] [place], [place] [address]  Chief Complaint  Patient presents with  . Hypogonadism    6 month follow up     HPI: [name] is a 56 year old Caucasian male with hypogonadism, erectile dysfunction and BPH with LUTS who presents today for follow-up  Hypogonadism Patient is experiencing a lack of energy, erections being less strong and a recent deterioration in an ability to play sports.  This is indicated by his responses to the ADAM questionnaire.  He is still having spontaneous erections at night.   He has sleep apnea and is sleeping with a CPAP machine.  His current testosterone level is 474 ng/dL on 08/11/2016.  He is currently managing his hypogonadism with Clomid 50 mg, 1/2 tablet daily.       Androgen Deficiency in the Aging Male    Davie Name 08/18/16 1300         Androgen Deficiency in the Aging Male   Do you have a decrease in libido (sex drive) No     Do you have lack of energy Yes     Do you have a decrease in strength and/or endurance No     Have you lost height No     Have you noticed a decreased "enjoyment of life" No     Are you sad and/or grumpy No     Are your erections less strong Yes     Have you noticed a recent deterioration in your ability to play sports Yes     Are you falling asleep after dinner No     Has there been a recent deterioration in your work performance No        BPH WITH LUTS His IPSS score today is 5, which is mild lower urinary tract symptomatology.  He is pleased with his quality life due to his urinary symptoms.  His previous IPSS score was 4/1.  He has no major complaints at this time.  He denies any dysuria, hematuria or suprapubic pain.  He currently taking tamsulosin 0.4 mg daily.  He also denies any recent fevers, chills, nausea or vomiting.   He does not have a family history of PCa.      IPSS    Row Name  08/18/16 1300         International Prostate Symptom Score   How often have you had the sensation of not emptying your bladder? Less than 1 in 5     How often have you had to urinate less than every two hours? Less than 1 in 5 times     How often have you found you stopped and started again several times when you urinated? Not at All     How often have you found it difficult to postpone urination? Not at All     How often have you had a weak urinary stream? Less than 1 in 5 times     How often have you had to strain to start urination? Less than 1 in 5 times     How many times did you typically get up at night to urinate? 1 Time     Total IPSS Score 5       Quality of Life due to urinary symptoms   If you were to spend the rest of your life with your urinary condition just the way  it is now how would you feel about that? Pleased        Score:  1-7 Mild 8-19 Moderate 20-35 Severe  Erectile dysfunction His SHIM score is 21, which is mild ED.   His previous SHIM score was 19.  He has been having difficulty with erections for the last few years.   His major complaint is achieving an erection.  His libido is diminished.   His risk factors for ED are age, BPH, hypogonadism, hypothyroidism, sleep apnea and depression .  He denies any painful erections or curvatures with his erections.         SHIM    Row Name 08/18/16 1328         SHIM: Over the last 6 months:   How do you rate your confidence that you could get and keep an erection? High     When you had erections with sexual stimulation, how often were your erections hard enough for penetration (entering your partner)? Almost Always or Always     During sexual intercourse, how often were you able to maintain your erection after you had penetrated (entered) your partner? Slightly Difficult     During sexual intercourse, how difficult was it to maintain your erection to completion of intercourse? Slightly Difficult     When you  attempted sexual intercourse, how often was it satisfactory for you? Slightly Difficult       SHIM Total Score   SHIM 21        Score: 1-7 Severe ED 8-11 Moderate ED 12-16 Mild-Moderate ED 17-21 Mild ED 22-25 No ED   PMH: Past Medical History:  Diagnosis Date  . Abdominal pain, right upper quadrant 09/17/2013  . Acid reflux   . Anxiety and depression   . Back pain 03/31/2015  . BPH (benign prostatic hyperplasia)   . BPH with obstruction/lower urinary tract symptoms 03/22/2015  . Frequency   . Hypogonadism in male   . Hypothyroidism   . Kidney cysts 09/17/2013  . Kidney lump 09/17/2013  . Over weight   . Overweight (BMI 25.0-29.9) 05/15/2015  . Preventative health care 03/31/2015  . Sleep apnea   . Testicular hypofunction   . Weak urinary stream     Surgical History: Past Surgical History:  Procedure Laterality Date  . none      Home Medications:  Allergies as of 08/18/2016   No Known Allergies     Medication List       Accurate as of 08/18/16  1:42 PM. Always use your most recent med list.          aspirin EC 81 MG tablet Take 81 mg by mouth daily.   baclofen 10 MG tablet Commonly known as:  LIORESAL Take by mouth.   clomiPHENE 50 MG tablet Commonly known as:  CLOMID TAKE ONE-HALF TABLET BY MOUTH ONCE DAILY   hydrocortisone 25 MG suppository Commonly known as:  ANUSOL-HC Place 1 suppository (25 mg total) rectally 2 (two) times daily as needed for hemorrhoids or itching. GENERIC please   levothyroxine 125 MCG tablet Commonly known as:  SYNTHROID, LEVOTHROID Take by mouth.   magnesium oxide 400 MG tablet Commonly known as:  MAG-OX Take by mouth.   nystatin cream Commonly known as:  MYCOSTATIN Apply 1 application topically 2 (two) times daily.   sertraline 100 MG tablet Commonly known as:  ZOLOFT Take 50 mg by mouth.   tamsulosin 0.4 MG Caps capsule Commonly known as:  FLOMAX Take 1 capsule (0.4  mg total) by mouth daily.   Vitamin D3 1000  units Caps Take by mouth.       Allergies: No Known Allergies  Family History: Family History  Problem Relation Age of Onset  . Stroke Mother   . Cancer Mother     breast  . Migraines Mother   . Heart disease      uncle  . Congestive Heart Failure Maternal Grandmother   . Cancer Paternal Grandmother     throat  . Skin cancer Maternal Grandfather   . Prostate cancer Paternal Uncle   . Kidney disease Neg Hx   . Kidney cancer Neg Hx     Social History:  reports that he has never smoked. He quit smokeless tobacco use about 31 years ago. He reports that he does not drink alcohol or use drugs.  ROS: UROLOGY Frequent Urination?: No Hard to postpone urination?: No Burning/pain with urination?: No Get up at night to urinate?: No Leakage of urine?: No Urine stream starts and stops?: No Trouble starting stream?: No Do you have to strain to urinate?: No Blood in urine?: No Urinary tract infection?: No Sexually transmitted disease?: No Injury to kidneys or bladder?: No Painful intercourse?: No Weak stream?: No Erection problems?: No Penile pain?: No  Gastrointestinal Nausea?: No Vomiting?: No Indigestion/heartburn?: No Diarrhea?: No Constipation?: No  Constitutional Fever: No Night sweats?: No Weight loss?: No Fatigue?: No  Skin Skin rash/lesions?: No Itching?: No  Eyes Blurred vision?: No Double vision?: No  Ears/Nose/Throat Sore throat?: No Sinus problems?: No  Hematologic/Lymphatic Swollen glands?: No Easy bruising?: No  Cardiovascular Leg swelling?: No Chest pain?: No  Respiratory Cough?: No Shortness of breath?: No  Endocrine Excessive thirst?: No  Musculoskeletal Back pain?: No Joint pain?: No  Neurological Headaches?: No Dizziness?: No  Psychologic Depression?: No Anxiety?: No  Physical Exam: BP 114/68   Pulse 80   Ht 5\' 10"  (1.778 m)   Wt 199 lb 8 oz (90.5 kg)   BMI 28.63 kg/m   Constitutional: Well nourished. Alert  and oriented, No acute distress. HEENT: [place] AT, moist mucus membranes. Trachea midline, no masses. Cardiovascular: No clubbing, cyanosis, or edema. Respiratory: Normal respiratory effort, no increased work of breathing. GI: Abdomen is soft, non tender, non distended, no abdominal masses. Liver and spleen not palpable.  No hernias appreciated.  Stool sample for occult testing is not indicated.   GU: No CVA tenderness.  No bladder fullness or masses.  Patient with circumcised phallus.  Urethral meatus is patent.  No penile discharge. No penile lesions or rashes. Scrotum without lesions, cysts, rashes and/or edema.  Testicles are located scrotally bilaterally. No masses are appreciated in the testicles. Left and right epididymis are normal. Rectal: Patient with  normal sphincter tone. Anus and perineum without scarring or rashes. No rectal masses are appreciated. Prostate is approximately 50 grams, no nodules are appreciated. Seminal vesicles are normal. Skin: No rashes, bruises or suspicious lesions. Lymph: No cervical or inguinal adenopathy. Neurologic: Grossly intact, no focal deficits, moving all 4 extremities. Psychiatric: Normal mood and affect.  Laboratory Data: Lab Results  Component Value Date   WBC 8.6 05/17/2016   HGB 14.9 05/17/2016   HCT 45.6 08/11/2016   MCV 90.8 05/17/2016   PLT 250 05/17/2016    Lab Results  Component Value Date   CREATININE 1.29 05/17/2016    Lab Results  Component Value Date   TESTOSTERONE 474 08/11/2016    Pertinent Imaging: Results for orders placed or performed  in visit on 08/11/16  PSA  Result Value Ref Range   Prostate Specific Ag, Serum 0.3 0.0 - 4.0 ng/mL  Testosterone  Result Value Ref Range   Testosterone 474 264 - 916 ng/dL  Hematocrit  Result Value Ref Range   Hematocrit 45.6 37.5 - 51.0 %  Hepatic function panel  Result Value Ref Range   Total Protein 6.6 6.0 - 8.5 g/dL   Albumin 4.1 3.5 - 5.5 g/dL   Bilirubin Total 0.4 0.0 -  1.2 mg/dL   Bilirubin, Direct 0.11 0.00 - 0.40 mg/dL   Alkaline Phosphatase 75 39 - 117 IU/L   AST 15 0 - 40 IU/L   ALT 18 0 - 44 IU/L  Previous PSA's:        0.2 ng/mL on 05/30/2014       0.3 ng/mL on 09/12/2015  0.3 ng/mL on 08/11/2016   Assessment & Plan:    1. Hypogonadism:     -most recent testosterone level is 474 ng/dL on 08/11/2016  -continue Clomid 50 mg, 1/2 tablet daily; refills given  -RTC in 6 months for HCT, testosterone, PSA, LFT's, ADAM and exam  2. BPH with LUTS  - IPSS score is 5/1, it is stable  - Continue conservative management, avoiding bladder irritants and timed voiding's  - Continue tamsulosin 0.4 mg daily; refills given  - RTC in 6 months for IPSS, PSA and exam, as testosterone therapy can cause prostate enlargement and worsen LUTS  3. Erectile dysfunction:     -SHIM score is 21  -RTC in 6 months for SHIM score and exam, as testosterone therapy can affect erections  4. Sleep apnea:   Patient is encouraged to use CPAP machine.  Emphasized the risk of HTN, heart disease, ED and death in untreated apnea.    Return in about 6 months (around 02/15/2017) for ADAM, IPSS, SHIM and exam, PSA, LFT's, HCT, testosterone (before 10 AM).  Zara Council, [facility] [address] [place], [place] [address] [phone]

## 2016-08-20 MED ORDER — LEVOTHYROXINE SODIUM 125 MCG PO TABS: 125.0000 ug | ORAL_TABLET | Freq: Every day | ORAL | 1 refills | Status: DC

## 2016-08-20 NOTE — Telephone Encounter (Signed)
Thank you Med list updated, new Rx for 125 mcg sent

## 2016-08-20 NOTE — Telephone Encounter (Signed)
Patient state he is taking 161mcg. He does not know why they sent the 100 mcg but he is sure he is taking 155mcg.

## 2016-09-01 DIAGNOSIS — Z713 Dietary counseling and surveillance: Secondary | ICD-10-CM | POA: Diagnosis not present

## 2016-09-29 ENCOUNTER — Other Ambulatory Visit: Payer: Self-pay | Admitting: Family Medicine

## 2016-09-29 DIAGNOSIS — Z713 Dietary counseling and surveillance: Secondary | ICD-10-CM | POA: Diagnosis not present

## 2016-09-29 NOTE — Telephone Encounter (Signed)
Rx request for 100 mcg thyroid med; wrong dose; we've been through this once He is taking 125 mcg; I denied the 100 mcg He should not be out of 125 mcg strength

## 2016-10-15 DIAGNOSIS — Z125 Encounter for screening for malignant neoplasm of prostate: Secondary | ICD-10-CM | POA: Diagnosis not present

## 2016-10-15 DIAGNOSIS — M5382 Other specified dorsopathies, cervical region: Secondary | ICD-10-CM | POA: Diagnosis not present

## 2016-10-15 DIAGNOSIS — Z1389 Encounter for screening for other disorder: Secondary | ICD-10-CM | POA: Diagnosis not present

## 2016-10-15 DIAGNOSIS — M542 Cervicalgia: Secondary | ICD-10-CM | POA: Diagnosis not present

## 2016-10-15 DIAGNOSIS — E039 Hypothyroidism, unspecified: Secondary | ICD-10-CM | POA: Diagnosis not present

## 2016-10-27 DIAGNOSIS — Z713 Dietary counseling and surveillance: Secondary | ICD-10-CM | POA: Diagnosis not present

## 2016-10-28 DIAGNOSIS — G4733 Obstructive sleep apnea (adult) (pediatric): Secondary | ICD-10-CM | POA: Diagnosis not present

## 2016-10-28 DIAGNOSIS — E039 Hypothyroidism, unspecified: Secondary | ICD-10-CM | POA: Diagnosis not present

## 2016-11-05 DIAGNOSIS — G4733 Obstructive sleep apnea (adult) (pediatric): Secondary | ICD-10-CM | POA: Diagnosis not present

## 2017-02-14 ENCOUNTER — Other Ambulatory Visit: Payer: BLUE CROSS/BLUE SHIELD

## 2017-02-14 DIAGNOSIS — N401 Enlarged prostate with lower urinary tract symptoms: Secondary | ICD-10-CM | POA: Diagnosis not present

## 2017-02-14 DIAGNOSIS — E291 Testicular hypofunction: Secondary | ICD-10-CM

## 2017-02-14 DIAGNOSIS — N138 Other obstructive and reflux uropathy: Secondary | ICD-10-CM

## 2017-02-15 LAB — HEPATIC FUNCTION PANEL
ALT: 11 IU/L (ref 0–44)
AST: 14 IU/L (ref 0–40)
Albumin: 4.1 g/dL (ref 3.5–5.5)
Alkaline Phosphatase: 54 IU/L (ref 39–117)
Bilirubin Total: 0.6 mg/dL (ref 0.0–1.2)
Bilirubin, Direct: 0.12 mg/dL (ref 0.00–0.40)
Total Protein: 6.3 g/dL (ref 6.0–8.5)

## 2017-02-15 LAB — PSA: Prostate Specific Ag, Serum: 0.4 ng/mL (ref 0.0–4.0)

## 2017-02-15 LAB — HEMATOCRIT: Hematocrit: 43.6 % (ref 37.5–51.0)

## 2017-02-15 LAB — TESTOSTERONE: Testosterone: 642 ng/dL (ref 264–916)

## 2017-02-15 NOTE — Progress Notes (Signed)
2:49 PM   Frank [name]. 03-14-1961 [id number]  Referring provider: Arnetha Courser, MD [address] [place], [place] [address]  Chief Complaint  Patient presents with  . Hypogonadism    6 month follow up  . Erectile Dysfunction    HPI: [name] is a 56 year old Caucasian male with testosterone deficiency, erectile dysfunction and BPH with LUTS who presents today for follow-up  Testosterone deficiency Patient is experiencing a lack of energy, a decrease in strength, erections being less strong and a recent deterioration in an ability to play sports.  This is indicated by his responses to the ADAM questionnaire.  He is no longer having spontaneous erections at night.   He has sleep apnea and is sleeping with a CPAP machine.  His current testosterone level is 642 ng/dL on 02/14/2017.  He is currently managing his hypogonadism with Clomid 50 mg, 1/2 tablet daily.  His HCT and LFT's are normal.       Androgen Deficiency in the Aging Male    Frank [name] Name 02/16/17 1400         Androgen Deficiency in the Aging Male   Do you have a decrease in libido (sex drive) No     Do you have lack of energy Yes     Do you have a decrease in strength and/or endurance Yes     Have you lost height No     Have you noticed a decreased "enjoyment of life" No     Are you sad and/or grumpy No     Are your erections less strong Yes     Have you noticed a recent deterioration in your ability to play sports Yes     Are you falling asleep after dinner No     Has there been a recent deterioration in your work performance No         BPH WITH LUTS His IPSS score today is 4, which is mild lower urinary tract symptomatology.  He is pleased with his quality life due to his urinary symptoms.  His previous IPSS score was 5/0.  He has no major complaints at this time.  He denies any dysuria, hematuria or suprapubic pain.  He currently taking tamsulosin 0.4 mg daily.  He also denies any recent  fevers, chills, nausea or vomiting.   He does not have a family history of PCa.      IPSS    Row Name 02/16/17 1400         International Prostate Symptom Score   How often have you had the sensation of not emptying your bladder? Less than 1 in 5     How often have you had to urinate less than every two hours? Less than 1 in 5 times     How often have you found you stopped and started again several times when you urinated? Not at All     How often have you found it difficult to postpone urination? Not at All     How often have you had a weak urinary stream? Not at All     How often have you had to strain to start urination? Less than 1 in 5 times     How many times did you typically get up at night to urinate? 1 Time     Total IPSS Score 4       Quality of Life due to urinary symptoms   If you  were to spend the rest of your life with your urinary condition just the way it is now how would you feel about that? Pleased        Score:  1-7 Mild 8-19 Moderate 20-35 Severe  Erectile dysfunction His SHIM score is 24, which is mild ED.   His previous SHIM score was 21.  He has been having difficulty with erections for the last few years.   His major complaint is achieving an erection.  His libido is diminished.   His risk factors for ED are age, BPH, testosterone deficiency, hypothyroidism, sleep apnea and depression .  He denies any painful erections or curvatures with his erections.         SHIM    Row Name 02/16/17 1403         SHIM: Over the last 6 months:   How do you rate your confidence that you could get and keep an erection? High     When you had erections with sexual stimulation, how often were your erections hard enough for penetration (entering your partner)? Almost Always or Always     During sexual intercourse, how often were you able to maintain your erection after you had penetrated (entered) your partner? Almost Always or Always     During sexual intercourse, how  difficult was it to maintain your erection to completion of intercourse? Not Difficult     When you attempted sexual intercourse, how often was it satisfactory for you? Almost Always or Always       SHIM Total Score   SHIM 24        Score: 1-7 Severe ED 8-11 Moderate ED 12-16 Mild-Moderate ED 17-21 Mild ED 22-25 No ED   PMH: Past Medical History:  Diagnosis Date  . Abdominal pain, right upper quadrant 09/17/2013  . Acid reflux   . Anxiety and depression   . Back pain 03/31/2015  . BPH (benign prostatic hyperplasia)   . BPH with obstruction/lower urinary tract symptoms 03/22/2015  . Frequency   . Hypogonadism in male   . Hypothyroidism   . Kidney cysts 09/17/2013  . Kidney lump 09/17/2013  . Over weight   . Overweight (BMI 25.0-29.9) 05/15/2015  . Preventative health care 03/31/2015  . Sleep apnea   . Testicular hypofunction   . Weak urinary stream     Surgical History: Past Surgical History:  Procedure Laterality Date  . none      Home Medications:  Allergies as of 02/16/2017   No Known Allergies     Medication List       Accurate as of 02/16/17  2:49 PM. Always use your most recent med list.          aspirin EC 81 MG tablet Take 81 mg by mouth daily.   baclofen 10 MG tablet Commonly known as:  LIORESAL Take by mouth.   clomiPHENE 50 MG tablet Commonly known as:  CLOMID Take 0.5 tablets (25 mg total) by mouth daily.   hydrocortisone 25 MG suppository Commonly known as:  ANUSOL-HC Place 1 suppository (25 mg total) rectally 2 (two) times daily as needed for hemorrhoids or itching. GENERIC please   levothyroxine 125 MCG tablet Commonly known as:  SYNTHROID, LEVOTHROID Take 1 tablet (125 mcg total) by mouth daily before breakfast.   magnesium oxide 400 MG tablet Commonly known as:  MAG-OX Take by mouth.   nystatin cream Commonly known as:  MYCOSTATIN Apply 1 application topically 2 (two) times daily.  sertraline 100 MG tablet Commonly known as:   ZOLOFT Take 50 mg by mouth.   tamsulosin 0.4 MG Caps capsule Commonly known as:  FLOMAX Take 1 capsule (0.4 mg total) by mouth daily.   Vitamin D3 1000 units Caps Take by mouth.       Allergies: No Known Allergies  Family History: Family History  Problem Relation Age of Onset  . Stroke Mother   . Cancer Mother        breast  . Migraines Mother   . Breast cancer Mother   . Heart disease Unknown        uncle  . Congestive Heart Failure Maternal Grandmother   . Cancer Paternal Grandmother        throat  . Skin cancer Maternal Grandfather   . Prostate cancer Paternal Uncle   . Kidney disease Neg Hx   . Kidney cancer Neg Hx   . Bladder Cancer Neg Hx     Social History:  reports that he has never smoked. He quit smokeless tobacco use about 31 years ago. He reports that he does not drink alcohol or use drugs.  ROS: UROLOGY Frequent Urination?: No Hard to postpone urination?: No Burning/pain with urination?: No Get up at night to urinate?: No Leakage of urine?: No Urine stream starts and stops?: No Trouble starting stream?: No Do you have to strain to urinate?: No Blood in urine?: No Urinary tract infection?: No Sexually transmitted disease?: No Injury to kidneys or bladder?: No Painful intercourse?: No Weak stream?: No Erection problems?: No Penile pain?: No  Gastrointestinal Nausea?: No Vomiting?: No Indigestion/heartburn?: No Diarrhea?: No Constipation?: Yes  Constitutional Fever: No Night sweats?: No Weight loss?: No Fatigue?: Yes  Skin Skin rash/lesions?: No Itching?: No  Eyes Blurred vision?: No Double vision?: No  Ears/Nose/Throat Sore throat?: No Sinus problems?: No  Hematologic/Lymphatic Swollen glands?: No Easy bruising?: No  Cardiovascular Leg swelling?: No Chest pain?: No  Respiratory Cough?: No Shortness of breath?: No  Endocrine Excessive thirst?: No  Musculoskeletal Back pain?: Yes Joint pain?:  No  Neurological Headaches?: Yes Dizziness?: No  Psychologic Depression?: No Anxiety?: Yes  Physical Exam: BP 120/76   Pulse 79   Ht 5\' 10"  (1.778 m)   Wt 199 lb 1.6 oz (90.3 kg)   BMI 28.57 kg/m   Constitutional: Well nourished. Alert and oriented, No acute distress. HEENT: [place] AT, moist mucus membranes. Trachea midline, no masses. Cardiovascular: No clubbing, cyanosis, or edema. Respiratory: Normal respiratory effort, no increased work of breathing. GI: Abdomen is soft, non tender, non distended, no abdominal masses. Liver and spleen not palpable.  No hernias appreciated.  Stool sample for occult testing is not indicated.   GU: No CVA tenderness.  No bladder fullness or masses.  Patient with circumcised phallus.  Urethral meatus is patent.  No penile discharge. No penile lesions or rashes. Scrotum without lesions, cysts, rashes and/or edema.  Testicles are located scrotally bilaterally. No masses are appreciated in the testicles. Left and right epididymis are normal. Rectal: Patient with  normal sphincter tone. Anus and perineum without scarring or rashes. No rectal masses are appreciated. Prostate is approximately 50 grams, no nodules are appreciated. Seminal vesicles are normal. Skin: No rashes, bruises or suspicious lesions. Lymph: No cervical or inguinal adenopathy. Neurologic: Grossly intact, no focal deficits, moving all 4 extremities. Psychiatric: Normal mood and affect.  Laboratory Data: Lab Results  Component Value Date   WBC 8.6 05/17/2016   HGB 14.9 05/17/2016  HCT 43.6 02/14/2017   MCV 90.8 05/17/2016   PLT 250 05/17/2016    Lab Results  Component Value Date   CREATININE 1.29 05/17/2016    Lab Results  Component Value Date   TESTOSTERONE 642 02/14/2017    Results for orders placed or performed in visit on 02/14/17  PSA  Result Value Ref Range   Prostate Specific Ag, Serum 0.4 0.0 - 4.0 ng/mL  Testosterone  Result Value Ref Range   Testosterone  642 264 - 916 ng/dL  Hepatic function panel  Result Value Ref Range   Total Protein 6.3 6.0 - 8.5 g/dL   Albumin 4.1 3.5 - 5.5 g/dL   Bilirubin Total 0.6 0.0 - 1.2 mg/dL   Bilirubin, Direct 0.12 0.00 - 0.40 mg/dL   Alkaline Phosphatase 54 39 - 117 IU/L   AST 14 0 - 40 IU/L   ALT 11 0 - 44 IU/L  Hematocrit  Result Value Ref Range   Hematocrit 43.6 37.5 - 51.0 %  Previous PSA's:        0.2 ng/mL on 05/30/2014       0.3 ng/mL on 09/12/2015  0.3 ng/mL on 08/11/2016  I have reviewed the labs   Assessment & Plan:    1. Testosterone deficiency  -most recent testosterone level is 642 ng/dL on 02/14/2017  -continue Clomid 50 mg, 1/2 tablet daily; refills given  -RTC in 6 months for HCT, testosterone, PSA, LFT's, ADAM and exam  2. BPH with LUTS  - IPSS score is 4/1, it is improved  - Continue conservative management, avoiding bladder irritants and timed voiding's  - Continue tamsulosin 0.4 mg daily; refills given  - RTC in 6 months for IPSS, PSA and exam, as testosterone therapy can cause prostate enlargement and worsen LUTS  3. Erectile dysfunction:     -SHIM score is 24, it is improving  -RTC in 6 months for SHIM score and exam, as testosterone therapy can affect erections  Return in about 6 months (around 08/19/2017) for ADAM, IPSS, SHIM and exam, PSA, LFT's, HCT/HBG, testosterone (before 10 AM).  Zara Council, [facility] [address], [place], [place] [address] [phone]

## 2017-02-16 ENCOUNTER — Encounter: Payer: Self-pay | Admitting: Urology

## 2017-02-16 ENCOUNTER — Ambulatory Visit (INDEPENDENT_AMBULATORY_CARE_PROVIDER_SITE_OTHER): Payer: BLUE CROSS/BLUE SHIELD | Admitting: Urology

## 2017-02-16 VITALS — BP 120/76 | HR 79 | Ht 70.0 in | Wt 199.1 lb

## 2017-02-16 DIAGNOSIS — E291 Testicular hypofunction: Secondary | ICD-10-CM | POA: Diagnosis not present

## 2017-02-16 DIAGNOSIS — N401 Enlarged prostate with lower urinary tract symptoms: Secondary | ICD-10-CM | POA: Diagnosis not present

## 2017-02-16 DIAGNOSIS — E349 Endocrine disorder, unspecified: Secondary | ICD-10-CM | POA: Diagnosis not present

## 2017-02-16 DIAGNOSIS — N138 Other obstructive and reflux uropathy: Secondary | ICD-10-CM | POA: Diagnosis not present

## 2017-02-16 DIAGNOSIS — N529 Male erectile dysfunction, unspecified: Secondary | ICD-10-CM | POA: Diagnosis not present

## 2017-02-16 MED ORDER — TAMSULOSIN HCL 0.4 MG PO CAPS: 0.4000 mg | ORAL_CAPSULE | Freq: Every day | ORAL | 3 refills | Status: DC

## 2017-02-16 MED ORDER — CLOMIPHENE CITRATE 50 MG PO TABS: 25.0000 mg | ORAL_TABLET | Freq: Every day | ORAL | 3 refills | Status: DC

## 2017-02-18 ENCOUNTER — Other Ambulatory Visit: Payer: Self-pay

## 2017-02-18 DIAGNOSIS — E291 Testicular hypofunction: Secondary | ICD-10-CM

## 2017-02-18 MED ORDER — CLOMIPHENE CITRATE 50 MG PO TABS: 25.0000 mg | ORAL_TABLET | Freq: Every day | ORAL | 1 refills | Status: DC

## 2017-03-25 DIAGNOSIS — H5310 Unspecified subjective visual disturbances: Secondary | ICD-10-CM | POA: Diagnosis not present

## 2017-03-25 DIAGNOSIS — H35713 Central serous chorioretinopathy, bilateral: Secondary | ICD-10-CM | POA: Diagnosis not present

## 2017-04-06 DIAGNOSIS — H169 Unspecified keratitis: Secondary | ICD-10-CM | POA: Diagnosis not present

## 2017-04-11 ENCOUNTER — Ambulatory Visit (INDEPENDENT_AMBULATORY_CARE_PROVIDER_SITE_OTHER): Payer: BLUE CROSS/BLUE SHIELD | Admitting: Family Medicine

## 2017-04-11 ENCOUNTER — Encounter: Payer: Self-pay | Admitting: Family Medicine

## 2017-04-11 VITALS — BP 102/64 | HR 95 | Temp 98.2°F | Resp 14 | Ht 69.0 in | Wt 196.8 lb

## 2017-04-11 DIAGNOSIS — H93A2 Pulsatile tinnitus, left ear: Secondary | ICD-10-CM

## 2017-04-11 DIAGNOSIS — Z0000 Encounter for general adult medical examination without abnormal findings: Secondary | ICD-10-CM

## 2017-04-11 DIAGNOSIS — Z23 Encounter for immunization: Secondary | ICD-10-CM

## 2017-04-11 DIAGNOSIS — H9193 Unspecified hearing loss, bilateral: Secondary | ICD-10-CM | POA: Diagnosis not present

## 2017-04-11 NOTE — Assessment & Plan Note (Signed)
Check labs USPSTF grade A and B recommendations reviewed with patient; age-appropriate recommendations, preventive care, screening tests, etc discussed and encouraged; healthy living encouraged; see AVS for patient education given to patient  

## 2017-04-11 NOTE — Patient Instructions (Addendum)
We'll get labs today We'll have you see the [facility] doctor If you have not heard anything from my staff in a week about any orders/referrals/studies from today, please contact us here to follow-up [phone]   Health Maintenance, Male A healthy lifestyle and preventive care is important for your health and wellness. Ask your health care provider about what schedule of regular examinations is right for you. What should I know about weight and diet? Eat a Healthy Diet  Eat plenty of vegetables, fruits, whole grains, low-fat dairy products, and lean protein.  Do not eat a lot of foods high in solid fats, added sugars, or salt.  Maintain a Healthy Weight Regular exercise can help you achieve or maintain a healthy weight. You should:  Do at least 150 minutes of exercise each week. The exercise should increase your heart rate and make you sweat (moderate-intensity exercise).  Do strength-training exercises at least twice a week.  Watch Your Levels of Cholesterol and Blood Lipids  Have your blood tested for lipids and cholesterol every 5 years starting at 54 years of age. If you are at high risk for heart disease, you should start having your blood tested when you are 54 years old. You may need to have your cholesterol levels checked more often if: ? Your lipid or cholesterol levels are high. ? You are older than 54 years of age. ? You are at high risk for heart disease.  What should I know about cancer screening? Many types of cancers can be detected early and may often be prevented. Lung Cancer  You should be screened every year for lung cancer if: ? You are a current smoker who has smoked for at least 30 years. ? You are a former smoker who has quit within the past 15 years.  Talk to your health care provider about your screening options, when you should start screening, and how often you should be screened.  Colorectal Cancer  Routine colorectal cancer screening  usually begins at 54 years of age and should be repeated every 5-10 years until you are 54 years old. You may need to be screened more often if early forms of precancerous polyps or small growths are found. Your health care provider may recommend screening at an earlier age if you have risk factors for colon cancer.  Your health care provider may recommend using home test kits to check for hidden blood in the stool.  A small camera at the end of a tube can be used to examine your colon (sigmoidoscopy or colonoscopy). This checks for the earliest forms of colorectal cancer.  Prostate and Testicular Cancer  Depending on your age and overall health, your health care provider may do certain tests to screen for prostate and testicular cancer.  Talk to your health care provider about any symptoms or concerns you have about testicular or prostate cancer.  Skin Cancer  Check your skin from head to toe regularly.  Tell your health care provider about any new moles or changes in moles, especially if: ? There is a change in a mole's size, shape, or color. ? You have a mole that is larger than a pencil eraser.  Always use sunscreen. Apply sunscreen liberally and repeat throughout the day.  Protect yourself by wearing long sleeves, pants, a wide-brimmed hat, and sunglasses when outside.  What should I know about heart disease, diabetes, and high blood pressure?  If you are 3-41 years of age, have your blood  pressure checked every 3-5 years. If you are 83 years of age or older, have your blood pressure checked every year. You should have your blood pressure measured twice-once when you are at a hospital or clinic, and once when you are not at a hospital or clinic. Record the average of the two measurements. To check your blood pressure when you are not at a hospital or clinic, you can use: ? An automated blood pressure machine at a pharmacy. ? A home blood pressure monitor.  Talk to your health care  provider about your target blood pressure.  If you are between 90-9 years old, ask your health care provider if you should take aspirin to prevent heart disease.  Have regular diabetes screenings by checking your fasting blood sugar level. ? If you are at a normal weight and have a low risk for diabetes, have this test once every three years after the age of 5. ? If you are overweight and have a high risk for diabetes, consider being tested at a younger age or more often.  A one-time screening for abdominal aortic aneurysm (AAA) by ultrasound is recommended for men aged 56-75 years who are current or former smokers. What should I know about preventing infection? Hepatitis B If you have a higher risk for hepatitis B, you should be screened for this virus. Talk with your health care provider to find out if you are at risk for hepatitis B infection. Hepatitis C Blood testing is recommended for:  Everyone born from [birth year] through 1963.  Anyone with known risk factors for hepatitis C.  Sexually Transmitted Diseases (STDs)  You should be screened each year for STDs including gonorrhea and chlamydia if: ? You are sexually active and are younger than 54 years of age. ? You are older than 54 years of age and your health care provider tells you that you are at risk for this type of infection. ? Your sexual activity has changed since you were last screened and you are at an increased risk for chlamydia or gonorrhea. Ask your health care provider if you are at risk.  Talk with your health care provider about whether you are at high risk of being infected with HIV. Your health care provider may recommend a prescription medicine to help prevent HIV infection.  What else can I do?  Schedule regular health, dental, and eye exams.  Stay current with your vaccines (immunizations).  Do not use any tobacco products, such as cigarettes, chewing tobacco, and e-cigarettes. If you need help quitting, ask  your health care provider.  Limit alcohol intake to no more than 2 drinks per day. One drink equals 12 ounces of beer, 5 ounces of wine, or 1 ounces of hard liquor.  Do not use street drugs.  Do not share needles.  Ask your health care provider for help if you need support or information about quitting drugs.  Tell your health care provider if you often feel depressed.  Tell your health care provider if you have ever been abused or do not feel safe at home. This information is not intended to replace advice given to you by your health care provider. Make sure you discuss any questions you have with your health care provider. Document Released: 01/01/2008 Document Revised: 03/03/2016 Document Reviewed: 04/08/2015 Elsevier Interactive Patient Education  [employer].
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
## 2017-04-12 LAB — CBC WITH DIFFERENTIAL/PLATELET
BASOS PCT: 0.3 %
Basophils Absolute: 23 cells/uL (ref 0–200)
EOS ABS: 173 {cells}/uL (ref 15–500)
Eosinophils Relative: 2.3 %
HEMATOCRIT: 45.3 % (ref 38.5–50.0)
Hemoglobin: 14.9 g/dL (ref 13.2–17.1)
LYMPHS ABS: 2753 {cells}/uL (ref 850–3900)
MCH: 29.3 pg (ref 27.0–33.0)
MCHC: 32.9 g/dL (ref 32.0–36.0)
MCV: 89 fL (ref 80.0–100.0)
MPV: 9.4 fL (ref 7.5–12.5)
Monocytes Relative: 7.6 %
NEUTROS PCT: 53.1 %
Neutro Abs: 3983 cells/uL (ref 1500–7800)
Platelets: 231 10*3/uL (ref 140–400)
RBC: 5.09 10*6/uL (ref 4.20–5.80)
RDW: 12.7 % (ref 11.0–15.0)
Total Lymphocyte: 36.7 %
WBC: 7.5 10*3/uL (ref 3.8–10.8)
WBCMIX: 570 {cells}/uL (ref 200–950)

## 2017-04-12 LAB — COMPLETE METABOLIC PANEL WITH GFR
AG RATIO: 1.7 (calc) (ref 1.0–2.5)
ALBUMIN MSPROF: 4.1 g/dL (ref 3.6–5.1)
ALKALINE PHOSPHATASE (APISO): 52 U/L (ref 40–115)
ALT: 14 U/L (ref 9–46)
AST: 15 U/L (ref 10–35)
BILIRUBIN TOTAL: 0.9 mg/dL (ref 0.2–1.2)
BUN: 15 mg/dL (ref 7–25)
CHLORIDE: 103 mmol/L (ref 98–110)
CO2: 26 mmol/L (ref 20–32)
Calcium: 9.2 mg/dL (ref 8.6–10.3)
Creat: 1.3 mg/dL (ref 0.70–1.33)
GFR, Est African American: 72 mL/min/{1.73_m2} (ref >=60)
GFR, Est Non African American: 62 mL/min/{1.73_m2} (ref >=60)
GLUCOSE: 108 mg/dL — AB (ref 65–99)
Globulin: 2.4 g/dL (calc) (ref 1.9–3.7)
POTASSIUM: 4.7 mmol/L (ref 3.5–5.3)
Sodium: 137 mmol/L (ref 135–146)
Total Protein: 6.5 g/dL (ref 6.1–8.1)

## 2017-04-12 LAB — LIPID PANEL
CHOL/HDL RATIO: 4.9 (calc) (ref <5.0)
Cholesterol: 224 mg/dL — ABNORMAL HIGH (ref <200)
HDL: 46 mg/dL (ref >40)
LDL CHOLESTEROL (CALC): 154 mg/dL — AB
Non-HDL Cholesterol (Calc): 178 mg/dL (calc) — ABNORMAL HIGH (ref <130)
TRIGLYCERIDES: 121 mg/dL (ref <150)

## 2017-04-12 LAB — TSH: TSH: 0.99 m[IU]/L (ref 0.40–4.50)

## 2017-04-13 DIAGNOSIS — R5383 Other fatigue: Secondary | ICD-10-CM | POA: Diagnosis not present

## 2017-04-13 DIAGNOSIS — E039 Hypothyroidism, unspecified: Secondary | ICD-10-CM | POA: Diagnosis not present

## 2017-04-13 DIAGNOSIS — Z79899 Other long term (current) drug therapy: Secondary | ICD-10-CM | POA: Diagnosis not present

## 2017-04-27 DIAGNOSIS — Z713 Dietary counseling and surveillance: Secondary | ICD-10-CM | POA: Diagnosis not present

## 2017-05-03 DIAGNOSIS — E039 Hypothyroidism, unspecified: Secondary | ICD-10-CM | POA: Diagnosis not present

## 2017-05-14 ENCOUNTER — Encounter: Payer: Self-pay | Admitting: Family Medicine

## 2017-05-16 ENCOUNTER — Other Ambulatory Visit: Payer: Self-pay | Admitting: Unknown Physician Specialty

## 2017-05-16 DIAGNOSIS — J340 Abscess, furuncle and carbuncle of nose: Secondary | ICD-10-CM | POA: Diagnosis not present

## 2017-05-16 DIAGNOSIS — H903 Sensorineural hearing loss, bilateral: Secondary | ICD-10-CM | POA: Diagnosis not present

## 2017-05-16 DIAGNOSIS — H6121 Impacted cerumen, right ear: Secondary | ICD-10-CM | POA: Diagnosis not present

## 2017-05-16 DIAGNOSIS — H93A2 Pulsatile tinnitus, left ear: Secondary | ICD-10-CM | POA: Diagnosis not present

## 2017-05-16 DIAGNOSIS — H9319 Tinnitus, unspecified ear: Secondary | ICD-10-CM

## 2017-05-19 ENCOUNTER — Ambulatory Visit
Admission: RE | Admit: 2017-05-19 | Discharge: 2017-05-19 | Disposition: A | Discharge status: Home / Self Care | Payer: BLUE CROSS/BLUE SHIELD | Source: Ambulatory Visit | Attending: Unknown Physician Specialty | Admitting: Unknown Physician Specialty

## 2017-05-19 DIAGNOSIS — H9319 Tinnitus, unspecified ear: Secondary | ICD-10-CM | POA: Insufficient documentation

## 2017-05-19 DIAGNOSIS — S0990XA Unspecified injury of head, initial encounter: Secondary | ICD-10-CM | POA: Diagnosis not present

## 2017-05-19 DIAGNOSIS — Z23 Encounter for immunization: Secondary | ICD-10-CM | POA: Diagnosis not present

## 2017-05-25 DIAGNOSIS — Z713 Dietary counseling and surveillance: Secondary | ICD-10-CM | POA: Diagnosis not present

## 2017-06-13 ENCOUNTER — Emergency Department: Payer: BLUE CROSS/BLUE SHIELD

## 2017-06-13 ENCOUNTER — Encounter: Payer: Self-pay | Admitting: Emergency Medicine

## 2017-06-13 ENCOUNTER — Emergency Department
Admission: EM | Admit: 2017-06-13 | Discharge: 2017-06-13 | Disposition: A | Discharge status: Home / Self Care | Payer: BLUE CROSS/BLUE SHIELD | Attending: Emergency Medicine | Admitting: Emergency Medicine

## 2017-06-13 DIAGNOSIS — M545 Low back pain, unspecified: Secondary | ICD-10-CM

## 2017-06-13 DIAGNOSIS — W19XXXA Unspecified fall, initial encounter: Secondary | ICD-10-CM

## 2017-06-13 DIAGNOSIS — Z79899 Other long term (current) drug therapy: Secondary | ICD-10-CM | POA: Insufficient documentation

## 2017-06-13 DIAGNOSIS — Z7982 Long term (current) use of aspirin: Secondary | ICD-10-CM | POA: Insufficient documentation

## 2017-06-13 DIAGNOSIS — M549 Dorsalgia, unspecified: Secondary | ICD-10-CM | POA: Diagnosis not present

## 2017-06-13 DIAGNOSIS — S299XXA Unspecified injury of thorax, initial encounter: Secondary | ICD-10-CM | POA: Diagnosis not present

## 2017-06-13 DIAGNOSIS — E039 Hypothyroidism, unspecified: Secondary | ICD-10-CM | POA: Diagnosis not present

## 2017-06-13 DIAGNOSIS — M546 Pain in thoracic spine: Secondary | ICD-10-CM | POA: Insufficient documentation

## 2017-06-13 DIAGNOSIS — S199XXA Unspecified injury of neck, initial encounter: Secondary | ICD-10-CM | POA: Diagnosis not present

## 2017-06-13 DIAGNOSIS — M542 Cervicalgia: Secondary | ICD-10-CM | POA: Diagnosis not present

## 2017-06-13 LAB — URINALYSIS, COMPLETE (UACMP) WITH MICROSCOPIC
Bacteria, UA: NONE SEEN
Bilirubin Urine: NEGATIVE
GLUCOSE, UA: NEGATIVE mg/dL
HGB URINE DIPSTICK: NEGATIVE
Ketones, ur: NEGATIVE mg/dL
Leukocytes, UA: NEGATIVE
NITRITE: NEGATIVE
PH: 6 (ref 5.0–8.0)
Protein, ur: NEGATIVE mg/dL
RBC / HPF: NONE SEEN RBC/hpf (ref 0–5)
SPECIFIC GRAVITY, URINE: 1.003 — AB (ref 1.005–1.030)
Squamous Epithelial / LPF: NONE SEEN

## 2017-06-13 MED ORDER — HYDROCODONE-ACETAMINOPHEN 5-325 MG PO TABS
2.0000 | ORAL_TABLET | Freq: Once | ORAL | Status: AC
  Administered 2017-06-13: 2 via ORAL
  Filled 2017-06-13: qty 2

## 2017-06-13 MED ORDER — HYDROCODONE-ACETAMINOPHEN 5-325 MG PO TABS: 1.0000 | ORAL_TABLET | Freq: Four times a day (QID) | ORAL | 0 refills | Status: DC | PRN

## 2017-06-13 NOTE — ED Provider Notes (Signed)
[facility] Emergency Department Provider Note  ____________________________________________   First MD Initiated Contact with Patient 06/13/17 1530     (approximate)  I have reviewed the triage vital signs and the nursing notes.   HISTORY  Chief Complaint Back Pain   HPI Frank [name]. is a 56 y.o. male who self presents to the emergency department with 1 week of diffuse moderate to severe lumbar thoracic and cervical spine pain.  It began suddenly 1 week ago when he was trimming trees and he fell from approximately 6 foot height landing on his bottom.  He has been able to ambulate since.  He had no numbness or weakness.  No bowel or bladder incontinence or hesitance.  He comes to the emergency department today because his pain is not improved.  It is somewhat worse with walking and somewhat improved with rest.    Past Medical History:  Diagnosis Date  . Abdominal pain, right upper quadrant 09/17/2013  . Acid reflux   . Anxiety and depression   . Back pain 03/31/2015  . BPH (benign prostatic hyperplasia)   . BPH with obstruction/lower urinary tract symptoms 03/22/2015  . Frequency   . Hypogonadism in male   . Hypothyroidism   . Kidney cysts 09/17/2013  . Kidney lump 09/17/2013  . Over weight   . Overweight (BMI 25.0-29.9) 05/15/2015  . Preventative health care 03/31/2015  . Sleep apnea   . Testicular hypofunction   . Weak urinary stream     Patient Active Problem List   Diagnosis Date Noted  . Frequent headaches 01/28/2016  . Erectile dysfunction of organic origin 09/16/2015  . Overweight (BMI 25.0-29.9) 05/15/2015  . Preventative health care 03/31/2015  . Hypothyroidism 03/31/2015  . Back pain 03/31/2015  . BPH with obstruction/lower urinary tract symptoms 03/22/2015  . Hypogonadism in male 03/22/2015  . Abdominal pain, right upper quadrant 09/17/2013  . Kidney cysts 09/17/2013  . Kidney lump 09/17/2013    Past Surgical History:    Procedure Laterality Date  . none      Prior to Admission medications   Medication Sig Start Date End Date Taking? Authorizing Provider  aspirin EC 81 MG tablet Take 81 mg by mouth daily.    [provider]  baclofen (LIORESAL) 10 MG tablet Take 10 mg by mouth daily.  05/17/16   [provider]  Cholecalciferol (VITAMIN D3) 1000 units CAPS Take by mouth.    [provider]  clomiPHENE (CLOMID) 50 MG tablet Take 0.5 tablets (25 mg total) by mouth daily. 02/18/17   Zara Council A, PA-C  HYDROcodone-acetaminophen (NORCO) 5-325 MG tablet Take 1 tablet by mouth every 6 (six) hours as needed for up to 15 doses for severe pain. 06/13/17   Darel Hong, MD  hydrocortisone (ANUSOL-HC) 25 MG suppository Place 1 suppository (25 mg total) rectally 2 (two) times daily as needed for hemorrhoids or itching. GENERIC please Patient not taking: Reported on 08/18/2016 07/26/16   Arnetha Courser, MD  levothyroxine (SYNTHROID, LEVOTHROID) 125 MCG tablet Take 1 tablet (125 mcg total) by mouth daily before breakfast. 08/20/16   Lada, Satira Anis, MD  magnesium oxide (MAG-OX) 400 MG tablet Take by mouth.    [provider]  Multiple Vitamin (MULTIVITAMIN) tablet Take 1 tablet by mouth daily.    [provider]  naproxen sodium (ANAPROX) 220 MG tablet Take 220 mg by mouth 2 (two) times daily with a meal.    [provider]  nystatin cream (MYCOSTATIN) Apply 1 application topically 2 (two) times daily. Patient not taking: Reported on 08/18/2016 05/17/16   Arnetha Courser, MD  prednisoLONE acetate (PRED MILD) 0.12 % ophthalmic suspension Place 1 drop into the right eye 2 (two) times daily.    [provider]  sertraline (ZOLOFT) 50 MG tablet Take 50 mg by mouth daily.  02/15/14   [provider]  tamsulosin (FLOMAX) 0.4 MG CAPS capsule Take 1 capsule (0.4 mg total) by mouth daily. 02/16/17   Zara Council A, PA-C    Allergies Patient has no known  allergies.  Family History  Problem Relation Age of Onset  . Stroke Mother   . Cancer Mother        breast  . Migraines Mother   . Breast cancer Mother   . Heart disease Unknown        uncle  . Congestive Heart Failure Maternal Grandmother   . Cancer Paternal Grandmother        throat  . Skin cancer Maternal Grandfather   . Prostate cancer Paternal Uncle   . Kidney disease Neg Hx   . Kidney cancer Neg Hx   . Bladder Cancer Neg Hx     Social History Social History   Tobacco Use  . Smoking status: Never Smoker  . Smokeless tobacco: Former Network engineer Use Topics  . Alcohol use: No    Alcohol/week: 0.0 oz  . Drug use: No    Review of Systems Constitutional: No fever/chills ENT: No sore throat. Cardiovascular: Denies chest pain. Respiratory: Denies shortness of breath. Gastrointestinal: No abdominal pain.  No nausea, no vomiting.  No diarrhea.  No constipation. Musculoskeletal: Positive for back pain. Neurological: Negative for headaches   ____________________________________________   PHYSICAL EXAM:  VITAL SIGNS: ED Triage Vitals [06/13/17 1310]  Enc Vitals Group     BP 115/73     Pulse Rate 71     Resp 15     Temp 98.2 F (36.8 C)     Temp Source Oral     SpO2 97 %     Weight 196 lb (88.9 kg)     Height 5\' 10"  (1.778 m)     Head Circumference      Peak Flow      Pain Score 5     Pain Loc      Pain Edu?      Excl. in [place]?     Constitutional: Alert and oriented x4 appears uncomfortable nontoxic no diaphoresis speaks in full clear sentences Head: Atraumatic. Nose: No congestion/rhinnorhea. Mouth/Throat: No trismus Neck: No stridor.  No midline tenderness or step-offs Cardiovascular: Regular rate and rhythm Respiratory: Normal respiratory effort.  No retractions. MSK: No midline back tenderness he is quite tender diffusely bilaterally paraspinal from lumbar to thoracic Neurologic:  Normal speech and language. No gross focal neurologic deficits  are appreciated.  5 out of 5 hip flexion hip extension plantar flexion dorsiflexion sensation intact light touch throughout 2+ DTRs no ankle clonus Skin:  Skin is warm, dry and intact. No rash noted.    ____________________________________________  LABS (all labs ordered are listed, but only abnormal results are displayed)  Labs Reviewed  URINALYSIS, COMPLETE (UACMP) WITH MICROSCOPIC - Abnormal; Notable for the following components:      Result Value   Color, Urine COLORLESS (*)    APPearance CLEAR (*)    Specific Gravity, Urine 1.003 (*)    All other components within normal limits  UA reviewed by me with no acute disease  __________________________________________  EKG   ____________________________________________  RADIOLOGY  CT scan of the neck as well as x-rays of the thoracic and lumbar spine reviewed by me with no acute disease ____________________________________________   DIFFERENTIAL includes but not limited to  Compression fracture, dislocation, radiculopathy, cauda equina syndrome   PROCEDURES  Procedure(s) performed: no  Procedures  Critical Care performed: no  Observation: no ____________________________________________   INITIAL IMPRESSION / ASSESSMENT AND PLAN / ED COURSE  Pertinent labs & imaging results that were available during my care of the patient were reviewed by me and considered in my medical decision making (see chart for details).  The patient arrives neurologically intact with no concerning red flags aside from trauma.  CT scan of his neck was negative prior to my evaluation and I obtained x-rays of the thoracic and lumbar spine which are fortunately negative as well.  Patient's pain is improved after symptomatic treatment and he remains neurologically intact.  This point he is stable for outpatient management verbalizes understanding and agree with the plan.  Strict return precautions have been given.       ____________________________________________   FINAL CLINICAL IMPRESSION(S) / ED DIAGNOSES  Final diagnoses:  Acute bilateral low back pain without sciatica  Acute bilateral thoracic back pain      NEW MEDICATIONS STARTED DURING THIS VISIT:  This SmartLink is deprecated. Use AVSMEDLIST instead to display the medication list for a patient.   Note:  This document was prepared using Dragon voice recognition software and may include unintentional dictation errors.      Darel Hong, MD 06/15/17 [phone]
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
## 2017-06-13 NOTE — ED Notes (Signed)
inst and RX given to patient, sig pad not working.

## 2017-06-13 NOTE — ED Triage Notes (Signed)
Pt comes into the ED via POV c/o shoulder and back pain that started Monday after a fall from 55ft high where he was cutting limbs from a tree.  Patient did not seek help on the day the fall occurred.  Patient states that he also previously had right sided kidney pain and now that has increased in pain as well.  Patient has been ambulatory at home but with much more discomfort.  Patient in NAD at this time with even and unlabored respirations.  The pain goes from below his neck to the lumbar region.

## 2017-06-17 ENCOUNTER — Encounter: Payer: Self-pay | Admitting: Family Medicine

## 2017-06-17 ENCOUNTER — Ambulatory Visit
Admission: RE | Admit: 2017-06-17 | Discharge: 2017-06-17 | Disposition: A | Discharge status: Home / Self Care | Payer: BLUE CROSS/BLUE SHIELD | Source: Ambulatory Visit | Attending: Family Medicine | Admitting: Family Medicine

## 2017-06-17 ENCOUNTER — Other Ambulatory Visit
Admission: RE | Admit: 2017-06-17 | Discharge: 2017-06-17 | Disposition: A | Discharge status: Home / Self Care | Payer: BLUE CROSS/BLUE SHIELD | Source: Ambulatory Visit | Attending: Family Medicine | Admitting: Family Medicine

## 2017-06-17 ENCOUNTER — Ambulatory Visit (INDEPENDENT_AMBULATORY_CARE_PROVIDER_SITE_OTHER): Payer: BLUE CROSS/BLUE SHIELD | Admitting: Family Medicine

## 2017-06-17 VITALS — BP 108/70 | HR 92 | Resp 12 | Wt 200.1 lb

## 2017-06-17 DIAGNOSIS — R109 Unspecified abdominal pain: Secondary | ICD-10-CM | POA: Diagnosis not present

## 2017-06-17 DIAGNOSIS — M546 Pain in thoracic spine: Secondary | ICD-10-CM | POA: Insufficient documentation

## 2017-06-17 DIAGNOSIS — N281 Cyst of kidney, acquired: Secondary | ICD-10-CM | POA: Diagnosis not present

## 2017-06-17 DIAGNOSIS — R350 Frequency of micturition: Secondary | ICD-10-CM | POA: Diagnosis not present

## 2017-06-17 DIAGNOSIS — S3991XA Unspecified injury of abdomen, initial encounter: Secondary | ICD-10-CM | POA: Diagnosis not present

## 2017-06-17 LAB — COMPREHENSIVE METABOLIC PANEL
ALBUMIN: 4.2 g/dL (ref 3.5–5.0)
ALT: 23 U/L (ref 17–63)
ANION GAP: 11 (ref 5–15)
AST: 22 U/L (ref 15–41)
Alkaline Phosphatase: 74 U/L (ref 38–126)
BILIRUBIN TOTAL: 0.6 mg/dL (ref 0.3–1.2)
BUN: 15 mg/dL (ref 6–20)
CO2: 24 mmol/L (ref 22–32)
Calcium: 9.1 mg/dL (ref 8.9–10.3)
Chloride: 104 mmol/L (ref 101–111)
Creatinine, Ser: 1.25 mg/dL — ABNORMAL HIGH (ref 0.61–1.24)
GFR calc Af Amer: >60 mL/min (ref >60)
Glucose, Bld: 98 mg/dL (ref 65–99)
POTASSIUM: 4 mmol/L (ref 3.5–5.1)
Sodium: 139 mmol/L (ref 135–145)
TOTAL PROTEIN: 7 g/dL (ref 6.5–8.1)

## 2017-06-17 LAB — CBC WITH DIFFERENTIAL/PLATELET
BASOS PCT: 1 %
Basophils Absolute: 0 10*3/uL (ref 0–0.1)
EOS PCT: 3 %
Eosinophils Absolute: 0.2 10*3/uL (ref 0–0.7)
HEMATOCRIT: 41.1 % (ref 40.0–52.0)
Hemoglobin: 14.1 g/dL (ref 13.0–18.0)
Lymphocytes Relative: 31 %
Lymphs Abs: 2.7 10*3/uL (ref 1.0–3.6)
MCH: 29.8 pg (ref 26.0–34.0)
MCHC: 34.3 g/dL (ref 32.0–36.0)
MCV: 87.1 fL (ref 80.0–100.0)
MONO ABS: 0.7 10*3/uL (ref 0.2–1.0)
MONOS PCT: 9 %
NEUTROS ABS: 4.9 10*3/uL (ref 1.4–6.5)
Neutrophils Relative %: 56 %
PLATELETS: 273 10*3/uL (ref 150–440)
RBC: 4.72 MIL/uL (ref 4.40–5.90)
RDW: 13.2 % (ref 11.5–14.5)
WBC: 8.7 10*3/uL (ref 3.8–10.6)

## 2017-06-17 LAB — POCT URINALYSIS DIPSTICK
BILIRUBIN UA: NEGATIVE
Blood, UA: NEGATIVE
Glucose, UA: NEGATIVE
KETONES UA: NEGATIVE
LEUKOCYTES UA: NEGATIVE
Nitrite, UA: NEGATIVE
PH UA: 6 (ref 5.0–8.0)
Protein, UA: NEGATIVE
SPEC GRAV UA: 1.015 (ref 1.010–1.025)
Urobilinogen, UA: 0.2 E.U./dL

## 2017-06-17 MED ORDER — CYCLOBENZAPRINE HCL 10 MG PO TABS: 5.0000 mg | ORAL_TABLET | Freq: Three times a day (TID) | ORAL | 0 refills | Status: DC | PRN

## 2017-06-17 NOTE — Patient Instructions (Signed)
-   Please go to [place] for STAT Labs to be drawn.  - Please present to [facility] ([place]) at 1:45pm for a 2:00 CT Scan.

## 2017-06-17 NOTE — Progress Notes (Signed)
Name: Frank [name].   MRN: [id number]    DOB: 1960-10-01   Date:06/17/2017       Progress Note  Subjective  Chief Complaint  Chief Complaint  Patient presents with  . Flank Pain    right    HPI  Patient presents with RIGHT midback/flank pain for over a month that occasionally radiates across his back to the left side.  He also fell about 2 weeks ago - was seen in the ER for back pain - had CT c-spine, and Xray of Lumbar and thoracic spine - all showing no acute fractures. The back pain from the fall been improving, was given vicodin in the ER and this made the pain at least tolerable. Has taken his Baclofen as well with only minimal relief. Endorses occasional frequency and urgency.  Denies abdominal/groin/testicular pain, NVD, dysuria, chest pain, or shortness of breath.  He denies seeing frank blood or clots in the urine.  No history of kidney stones.   Reports that he saw Dr. Jacqlyn Larsen, urology, back in 2015 for ongoing RUQ pain (has hx renal cysts).  Gall bladder still intact - was determined not to be an issue at the time; pt was told he had elevated uric acid levels, but never had kidney stones.  Per [place] Retroperitoneal Complete from 09/21/2013, pt has simple 1/4cm left renal cyst.  Patient Active Problem List   Diagnosis Date Noted  . Frequent headaches 01/28/2016  . Erectile dysfunction of organic origin 09/16/2015  . Overweight (BMI 25.0-29.9) 05/15/2015  . Preventative health care 03/31/2015  . Hypothyroidism 03/31/2015  . Back pain 03/31/2015  . BPH with obstruction/lower urinary tract symptoms 03/22/2015  . Hypogonadism in male 03/22/2015  . Abdominal pain, right upper quadrant 09/17/2013  . Kidney cysts 09/17/2013  . Kidney lump 09/17/2013    Social History   Tobacco Use  . Smoking status: Never Smoker  . Smokeless tobacco: Former Network engineer Use Topics  . Alcohol use: No    Alcohol/week: 0.0 oz     Current Outpatient Medications:  .  aspirin EC 81 MG  tablet, Take 81 mg by mouth daily., Disp: , Rfl:  .  baclofen (LIORESAL) 10 MG tablet, Take 10 mg by mouth daily. , Disp: , Rfl:  .  Cholecalciferol (VITAMIN D3) 1000 units CAPS, Take by mouth., Disp: , Rfl:  .  clomiPHENE (CLOMID) 50 MG tablet, Take 0.5 tablets (25 mg total) by mouth daily., Disp: 90 tablet, Rfl: 1 .  HYDROcodone-acetaminophen (NORCO) 5-325 MG tablet, Take 1 tablet by mouth every 6 (six) hours as needed for up to 15 doses for severe pain., Disp: 15 tablet, Rfl: 0 .  hydrocortisone (ANUSOL-HC) 25 MG suppository, Place 1 suppository (25 mg total) rectally 2 (two) times daily as needed for hemorrhoids or itching. GENERIC please, Disp: 24 suppository, Rfl: 0 .  levothyroxine (SYNTHROID, LEVOTHROID) 125 MCG tablet, Take 1 tablet (125 mcg total) by mouth daily before breakfast., Disp: 90 tablet, Rfl: 1 .  naproxen sodium (ANAPROX) 220 MG tablet, Take 220 mg by mouth 2 (two) times daily with a meal., Disp: , Rfl:  .  sertraline (ZOLOFT) 50 MG tablet, Take 50 mg by mouth daily. , Disp: , Rfl:  .  tamsulosin (FLOMAX) 0.4 MG CAPS capsule, Take 1 capsule (0.4 mg total) by mouth daily., Disp: 90 capsule, Rfl: 3 .  magnesium oxide (MAG-OX) 400 MG tablet, Take by mouth., Disp: , Rfl:  .  Multiple Vitamin (MULTIVITAMIN) tablet, Take 1  tablet by mouth daily., Disp: , Rfl:  .  nystatin cream (MYCOSTATIN), Apply 1 application topically 2 (two) times daily. (Patient not taking: Reported on 08/18/2016), Disp: 30 g, Rfl: 0 .  prednisoLONE acetate (PRED MILD) 0.12 % ophthalmic suspension, Place 1 drop into the right eye 2 (two) times daily., Disp: , Rfl:   No Known Allergies  ROS  Constitutional: Negative for fever or weight change.  Respiratory: Negative for cough and shortness of breath.   Cardiovascular: Negative for chest pain or palpitations.  Gastrointestinal: Negative for abdominal pain, no bowel changes.  Musculoskeletal: Negative for gait problem or joint swelling. See HPI Skin: Negative  for rash.  Neurological: Negative for dizziness or headache. Negative for extremity weakness/numbness/tingling. Negative for incontinence of urine or stool. No other specific complaints in a complete review of systems (except as listed in HPI above).  Objective  Vitals:   06/17/17 1109  BP: 108/70  Pulse: 92  Resp: 12  SpO2: 98%  Weight: 200 lb 1.6 oz (90.8 kg)   Body mass index is 28.71 kg/m.  Nursing Note and Vital Signs reviewed.  Physical Exam  Musculoskeletal:       Arms:   Constitutional: Patient appears well-developed and well-nourished. No distress.  HEENT: head atraumatic, normocephalic Cardiovascular: Normal rate, regular rhythm, S1/S2 present.  No murmur or rub heard. No BLE edema. Pulmonary/Chest: Effort normal and breath sounds clear. No respiratory distress or retractions. Abdominal: Soft and non-tender, bowel sounds present x4 quadrants. Negative Murphy's.  Equivocal CVA tenderness. Psychiatric: Patient has a normal mood and affect. behavior is normal. Judgment and thought content normal. Neck: Normal range of motion. Neck supple. No JVD present. No thyromegaly present.  Musculoskeletal: Normal range of motion, no joint effusions. No gross deformities.  Tenderness along RIGHT thoracic musculature; no spinal tenderness present.  Upon going from lying to sitting, pt complains of increase in pain and palpable spasm present in area marked above. Neurological: he is alert and oriented to person, place, and time. No cranial nerve deficit. Coordination, balance, strength, speech and gait are normal.  Skin: Skin is warm and dry. No rash noted. No erythema.   Recent Results (from the past 2160 hour(s))  CBC with Differential/Platelet     Status: None   Collection Time: 04/11/17 12:22 PM  Result Value Ref Range   WBC 7.5 3.8 - 10.8 Thousand/uL   RBC 5.09 4.20 - 5.80 Million/uL   Hemoglobin 14.9 13.2 - 17.1 g/dL   HCT 45.3 38.5 - 50.0 %   MCV 89.0 80.0 - 100.0 fL   MCH  29.3 27.0 - 33.0 pg   MCHC 32.9 32.0 - 36.0 g/dL   RDW 12.7 11.0 - 15.0 %   Platelets 231 140 - 400 Thousand/uL   MPV 9.4 7.5 - 12.5 fL   Neutro Abs 3,983 1,500 - 7,800 cells/uL   Lymphs Abs 2,753 850 - 3,900 cells/uL   WBC mixed population 570 200 - 950 cells/uL   Eosinophils Absolute 173 15 - 500 cells/uL   Basophils Absolute 23 0 - 200 cells/uL   Neutrophils Relative % 53.1 %   Total Lymphocyte 36.7 %   Monocytes Relative 7.6 %   Eosinophils Relative 2.3 %   Basophils Relative 0.3 %  Lipid panel     Status: Abnormal   Collection Time: 04/11/17 12:22 PM  Result Value Ref Range   Cholesterol 224 (H) <200 mg/dL   HDL 46 >40 mg/dL   Triglycerides 121 <150 mg/dL   LDL  Cholesterol (Calc) 154 (H) mg/dL (calc)    Comment: Reference range: <100 . Desirable range <100 mg/dL for primary prevention;   <70 mg/dL for patients with CHD or diabetic patients  with > or = 2 CHD risk factors. Marland Kitchen LDL-C is now calculated using the Martin-Hopkins  calculation, which is a validated novel method providing  better accuracy than the Friedewald equation in the  estimation of LDL-C.  Cresenciano Genre et al. Annamaria Helling. 6195;093(26): 2061-2068  (http://education.[employer].com/faq/FAQ164)    Total CHOL/HDL Ratio 4.9 <5.0 (calc)   Non-HDL Cholesterol (Calc) 178 (H) <130 mg/dL (calc)    Comment: For patients with diabetes plus 1 major ASCVD risk  factor, treating to a non-HDL-C goal of <100 mg/dL  (LDL-C of <70 mg/dL) is considered a therapeutic  option.   TSH     Status: None   Collection Time: 04/11/17 12:22 PM  Result Value Ref Range   TSH 0.99 0.40 - 4.50 mIU/L  COMPLETE METABOLIC PANEL WITH GFR     Status: Abnormal   Collection Time: 04/11/17 12:22 PM  Result Value Ref Range   Glucose, Bld 108 (H) 65 - 99 mg/dL    Comment: .            Fasting reference interval . For someone without known diabetes, a glucose value between 100 and 125 mg/dL is consistent with prediabetes and should be  confirmed with a follow-up test. .    BUN 15 7 - 25 mg/dL   Creat 1.30 0.70 - 1.33 mg/dL    Comment: For patients >66 years of age, the reference limit for Creatinine is approximately 13% higher for people identified as African-American. .    GFR, Est Non African American 62 > OR = 60 mL/min/1.66m2   GFR, Est African American 72 > OR = 60 mL/min/1.13m2   BUN/Creatinine Ratio NOT APPLICABLE 6 - 22 (calc)   Sodium 137 135 - 146 mmol/L   Potassium 4.7 3.5 - 5.3 mmol/L   Chloride 103 98 - 110 mmol/L   CO2 26 20 - 32 mmol/L   Calcium 9.2 8.6 - 10.3 mg/dL   Total Protein 6.5 6.1 - 8.1 g/dL   Albumin 4.1 3.6 - 5.1 g/dL   Globulin 2.4 1.9 - 3.7 g/dL (calc)   AG Ratio 1.7 1.0 - 2.5 (calc)   Total Bilirubin 0.9 0.2 - 1.2 mg/dL   Alkaline phosphatase (APISO) 52 40 - 115 U/L   AST 15 10 - 35 U/L   ALT 14 9 - 46 U/L  Urinalysis, Complete w Microscopic     Status: Abnormal   Collection Time: 06/13/17  2:30 PM  Result Value Ref Range   Color, Urine COLORLESS (A) YELLOW   APPearance CLEAR (A) CLEAR   Specific Gravity, Urine 1.003 (L) 1.005 - 1.030   pH 6.0 5.0 - 8.0   Glucose, UA NEGATIVE NEGATIVE mg/dL   Hgb urine dipstick NEGATIVE NEGATIVE   Bilirubin Urine NEGATIVE NEGATIVE   Ketones, ur NEGATIVE NEGATIVE mg/dL   Protein, ur NEGATIVE NEGATIVE mg/dL   Nitrite NEGATIVE NEGATIVE   Leukocytes, UA NEGATIVE NEGATIVE   RBC / HPF NONE SEEN 0 - 5 RBC/hpf   WBC, UA 0-5 0 - 5 WBC/hpf   Bacteria, UA NONE SEEN NONE SEEN   Squamous Epithelial / LPF NONE SEEN NONE SEEN    Assessment & Plan  1. Right-sided thoracic back pain, unspecified chronicity - POCT urinalysis dipstick - CBC - CT RENAL STONE STUDY; Future - Comprehensive metabolic panel -  cyclobenzaprine (FLEXERIL) 10 MG tablet; Take 0.5-1 tablets (5-10 mg total) by mouth 3 (three) times daily as needed for muscle spasms.  Dispense: 20 tablet; Refill: 0 - Baclofen has been ineffective, and Vicodin has only provided minimal relief. We  will try Flexeril.  Discussed sedating effects of flexeril alone and the increased effect it can have with alcohol consumption and narcotic use such as Vicodin - pt verbalizes understanding and is in agreement to stop use of Vicodin while taking flexeril.  2. Increased urinary frequency - POCT urinalysis dipstick - CBC - Comprehensive metabolic panel  3. Right flank pain - CBC - CT RENAL STONE STUDY; Future - Comprehensive metabolic panel  4. Kidney cysts - CT RENAL STONE STUDY; Future - Comprehensive metabolic panel  - Stone Study scheduled for 2pm at [facility], pt aware of appointment. - Advised that due to unclear and ongoing nature of symptoms and history of renal cysts, we will treat for MSK pain and spasm with muscle cyclobenzaprine, and evaluate for possible renal issues as well.  Plan of care is discussed in detail with Dr. Enid Derry who is in agreement with the plan.  -Red flags and when to present for emergency care or RTC including fever >101.29F, chest pain, shortness of breath, hematuria, NVD, extremity weakness/numbness/tingling, new/worsening/un-resolving symptoms, reviewed with patient at time of visit. Follow up and care instructions discussed and provided in AVS.

## 2017-06-20 ENCOUNTER — Ambulatory Visit: Payer: BLUE CROSS/BLUE SHIELD | Admitting: Family Medicine

## 2017-06-20 ENCOUNTER — Telehealth: Payer: Self-pay | Admitting: Family Medicine

## 2017-06-20 NOTE — Telephone Encounter (Signed)
Please call and check in on patient. How is his pain today? How did he do over the weekend? Thanks!

## 2017-06-20 NOTE — Telephone Encounter (Signed)
Frank [name],  I spoke with pt and he states that with certain movements he still has sharp pain in your back, he states it is slightly better. He states he was able to rest well over the weekend, and place heat on his back. He did state the flexeril has helped him as well.

## 2017-06-22 ENCOUNTER — Ambulatory Visit: Payer: BLUE CROSS/BLUE SHIELD | Admitting: Family Medicine

## 2017-06-24 ENCOUNTER — Encounter: Payer: Self-pay | Admitting: Emergency Medicine

## 2017-06-24 ENCOUNTER — Ambulatory Visit
Admission: RE | Admit: 2017-06-24 | Discharge: 2017-06-24 | Disposition: A | Discharge status: Home / Self Care | Payer: BLUE CROSS/BLUE SHIELD | Source: Ambulatory Visit | Attending: Family Medicine | Admitting: Family Medicine

## 2017-06-24 ENCOUNTER — Other Ambulatory Visit: Payer: Self-pay

## 2017-06-24 ENCOUNTER — Telehealth: Payer: Self-pay | Admitting: Family Medicine

## 2017-06-24 ENCOUNTER — Ambulatory Visit
Admission: EM | Admit: 2017-06-24 | Discharge: 2017-06-24 | Disposition: A | Discharge status: Home / Self Care | Payer: BLUE CROSS/BLUE SHIELD | Attending: Family Medicine | Admitting: Family Medicine

## 2017-06-24 DIAGNOSIS — M79662 Pain in left lower leg: Secondary | ICD-10-CM | POA: Diagnosis not present

## 2017-06-24 DIAGNOSIS — R590 Localized enlarged lymph nodes: Secondary | ICD-10-CM | POA: Diagnosis not present

## 2017-06-24 DIAGNOSIS — R1909 Other intra-abdominal and pelvic swelling, mass and lump: Secondary | ICD-10-CM | POA: Diagnosis not present

## 2017-06-24 DIAGNOSIS — M79605 Pain in left leg: Secondary | ICD-10-CM | POA: Diagnosis not present

## 2017-06-24 DIAGNOSIS — R300 Dysuria: Secondary | ICD-10-CM

## 2017-06-24 MED ORDER — CIPROFLOXACIN HCL 500 MG PO TABS: 500.0000 mg | ORAL_TABLET | Freq: Two times a day (BID) | ORAL | 0 refills | Status: DC

## 2017-06-24 NOTE — Discharge Instructions (Signed)
Further recommendations after ultrasound

## 2017-06-24 NOTE — ED Provider Notes (Signed)
[facility]    CSN: [id number] Arrival date & time: 06/24/17  1615     History   Chief Complaint Chief Complaint  Patient presents with  . Abdominal Pain    left sided    HPI Frank [name]. is a 56 y.o. male.   56 yo male with a c/o left groin pain since this morning and a tender lump in the left groin area. States pain radiates down the leg and also feels pain in the calf. States he fell off a ladder several weeks ago but does not recall injuring his leg. Also c/o mild discomfort when urinating but denies any penile discharge. States he has a h/o prostate problems. Patient denies any fevers, chills.    The history is provided by the patient.  Abdominal Pain    Past Medical History:  Diagnosis Date  . Abdominal pain, right upper quadrant 09/17/2013  . Acid reflux   . Anxiety and depression   . Back pain 03/31/2015  . BPH (benign prostatic hyperplasia)   . BPH with obstruction/lower urinary tract symptoms 03/22/2015  . Frequency   . Hypogonadism in male   . Hypothyroidism   . Kidney cysts 09/17/2013  . Kidney lump 09/17/2013  . Over weight   . Overweight (BMI 25.0-29.9) 05/15/2015  . Preventative health care 03/31/2015  . Sleep apnea   . Testicular hypofunction   . Weak urinary stream     Patient Active Problem List   Diagnosis Date Noted  . Frequent headaches 01/28/2016  . Erectile dysfunction of organic origin 09/16/2015  . Overweight (BMI 25.0-29.9) 05/15/2015  . Preventative health care 03/31/2015  . Hypothyroidism 03/31/2015  . Back pain 03/31/2015  . BPH with obstruction/lower urinary tract symptoms 03/22/2015  . Hypogonadism in male 03/22/2015  . Abdominal pain, right upper quadrant 09/17/2013  . Kidney cysts 09/17/2013  . Kidney lump 09/17/2013    Past Surgical History:  Procedure Laterality Date  . none         Home Medications    Prior to Admission medications   Medication Sig Start Date End Date Taking? Authorizing Provider    aspirin EC 81 MG tablet Take 81 mg by mouth daily.   Yes [provider]  Cholecalciferol (VITAMIN D3) 1000 units CAPS Take by mouth.   Yes [provider]  clomiPHENE (CLOMID) 50 MG tablet Take 0.5 tablets (25 mg total) by mouth daily. 02/18/17  Yes McGowan, Larene Beach A, PA-C  cyclobenzaprine (FLEXERIL) 10 MG tablet Take 0.5-1 tablets (5-10 mg total) by mouth 3 (three) times daily as needed for muscle spasms. 06/17/17  Yes Hubbard Hartshorn, FNP  levothyroxine (SYNTHROID, LEVOTHROID) 125 MCG tablet Take 1 tablet (125 mcg total) by mouth daily before breakfast. 08/20/16  Yes Lada, Satira Anis, MD  Multiple Vitamin (MULTIVITAMIN) tablet Take 1 tablet by mouth daily.   Yes [provider]  naproxen sodium (ANAPROX) 220 MG tablet Take 220 mg by mouth 2 (two) times daily with a meal.   Yes [provider]  sertraline (ZOLOFT) 50 MG tablet Take 50 mg by mouth daily.  02/15/14  Yes [provider]  tamsulosin (FLOMAX) 0.4 MG CAPS capsule Take 1 capsule (0.4 mg total) by mouth daily. 02/16/17  Yes McGowan, Larene Beach A, PA-C  HYDROcodone-acetaminophen (NORCO) 5-325 MG tablet Take 1 tablet by mouth every 6 (six) hours as needed for up to 15 doses for severe pain. 06/13/17   Darel Hong, MD  hydrocortisone (ANUSOL-HC) 25 MG suppository  Place 1 suppository (25 mg total) rectally 2 (two) times daily as needed for hemorrhoids or itching. GENERIC please 07/26/16   Arnetha Courser, MD  magnesium oxide (MAG-OX) 400 MG tablet Take by mouth.    [provider]  nystatin cream (MYCOSTATIN) Apply 1 application topically 2 (two) times daily. Patient not taking: Reported on 08/18/2016 05/17/16   Arnetha Courser, MD  prednisoLONE acetate (PRED MILD) 0.12 % ophthalmic suspension Place 1 drop into the right eye 2 (two) times daily.    [provider]    Family History Family History  Problem Relation Age of Onset  . Stroke Mother   . Cancer Mother        breast  .  Migraines Mother   . Breast cancer Mother   . Heart disease Unknown        uncle  . Congestive Heart Failure Maternal Grandmother   . Cancer Paternal Grandmother        throat  . Skin cancer Maternal Grandfather   . Prostate cancer Paternal Uncle   . Kidney disease Neg Hx   . Kidney cancer Neg Hx   . Bladder Cancer Neg Hx     Social History Social History   Tobacco Use  . Smoking status: Never Smoker  . Smokeless tobacco: Former Network engineer Use Topics  . Alcohol use: No    Alcohol/week: 0.0 oz  . Drug use: No     Allergies   Patient has no known allergies.   Review of Systems Review of Systems  Gastrointestinal: Positive for abdominal pain.     Physical Exam Triage Vital Signs ED Triage Vitals  Enc Vitals Group     BP 06/24/17 1632 120/70     Pulse Rate 06/24/17 1632 95     Resp 06/24/17 1632 16     Temp 06/24/17 1632 98.3 F (36.8 C)     Temp Source 06/24/17 1632 Oral     SpO2 06/24/17 1632 98 %     Weight 06/24/17 1630 196 lb (88.9 kg)     Height 06/24/17 1630 5\' 10"  (1.778 m)     Head Circumference --      Peak Flow --      Pain Score 06/24/17 1630 4     Pain Loc --      Pain Edu? --      Excl. in [place]? --    No data found.  Updated Vital Signs BP 120/70 (BP Location: Left Arm)   Pulse 95   Temp 98.3 F (36.8 C) (Oral)   Resp 16   Ht 5\' 10"  (1.778 m)   Wt 196 lb (88.9 kg)   SpO2 98%   BMI 28.12 kg/m   Visual Acuity Right Eye Distance:   Left Eye Distance:   Bilateral Distance:    Right Eye Near:   Left Eye Near:    Bilateral Near:     Physical Exam  Constitutional: He is oriented to person, place, and time. He appears well-developed and well-nourished. No distress.  HENT:  Head: Normocephalic and atraumatic.  Cardiovascular: Normal rate, regular rhythm, normal heart sounds and intact distal pulses.  No murmur heard. Pulmonary/Chest: Effort normal and breath sounds normal. No respiratory distress. He has no wheezes. He has no  rales.  Abdominal: Soft. Bowel sounds are normal. He exhibits no distension and no mass. There is no tenderness. There is no rebound and no guarding.  Genitourinary: Testes normal and penis normal. No  discharge found.  Lymphadenopathy: Inguinal adenopathy noted on the left side. No inguinal adenopathy noted on the right side.  Neurological: He is alert and oriented to person, place, and time.  Skin: No rash noted. He is not diaphoretic.  Nursing note and vitals reviewed.    UC Treatments / Results  Labs (all labs ordered are listed, but only abnormal results are displayed) Labs Reviewed - No data to display  EKG  EKG Interpretation None       Radiology US Venous Img Lower Unilateral Left  Result Date: 06/24/2017 CLINICAL DATA:  Left groin mass. EXAM: LEFT LOWER EXTREMITY VENOUS DOPPLER ULTRASOUND TECHNIQUE: Gray-scale sonography with graded compression, as well as color Doppler and duplex ultrasound were performed to evaluate the lower extremity deep venous systems from the level of the common femoral vein and including the common femoral, femoral, profunda femoral, popliteal and calf veins including the posterior tibial, peroneal and gastrocnemius veins when visible. The superficial great saphenous vein was also interrogated. Spectral Doppler was utilized to evaluate flow at rest and with distal augmentation maneuvers in the common femoral, femoral and popliteal veins. COMPARISON:  None. FINDINGS: Contralateral Common Femoral Vein: Respiratory phasicity is normal and symmetric with the symptomatic side. No evidence of thrombus. Normal compressibility. Common Femoral Vein: No evidence of thrombus. Normal compressibility, respiratory phasicity and response to augmentation. Saphenofemoral Junction: No evidence of thrombus. Normal compressibility and flow on color Doppler imaging. Profunda Femoral Vein: No evidence of thrombus. Normal compressibility and flow on color Doppler imaging. Femoral  Vein: No evidence of thrombus. Normal compressibility, respiratory phasicity and response to augmentation. Popliteal Vein: No evidence of thrombus. Normal compressibility, respiratory phasicity and response to augmentation. Calf Veins: No evidence of thrombus. Normal compressibility and flow on color Doppler imaging. Superficial Great Saphenous Vein: No evidence of thrombus. Normal compressibility. Venous Reflux:  None. Other Findings:  None. IMPRESSION: No evidence of deep venous thrombosis. Electronically Signed   By: Dorise Bullion III M.D   On: 06/24/2017 19:46   [place] Lt Lower Extrem Ltd Soft Tissue Non Vascular  Result Date: 06/24/2017 CLINICAL DATA:  Painful lesion left groin. EXAM: ULTRASOUND LEFT LOWER EXTREMITY LIMITED TECHNIQUE: Ultrasound examination of the lower extremity soft tissues was performed in the area of clinical concern. COMPARISON:  None. FINDINGS: Scanning in the region of concern demonstrates a hypoechoic structure in the subcutaneous tissues. The lesion measures 2.2 x 1.1 x 1.6 cm and is likely a lymph node. No fluid collection is identified. IMPRESSION: Subcutaneous lesion in the region of concern is likely a lymph node but cannot be definitively characterized. Electronically Signed   By: Inge Rise M.D.   On: 06/24/2017 19:36    Procedures Procedures (including critical care time)  Medications Ordered in UC Medications - No data to display   Initial Impression / Assessment and Plan / UC Course  I have reviewed the triage vital signs and the nursing notes.  Pertinent labs & imaging results that were available during my care of the patient were reviewed by me and considered in my medical decision making (see chart for details).       Final Clinical Impressions(s) / UC Diagnoses   Final diagnoses:  Left leg pain  Left groin mass  Inguinal lymphadenopathy  Dysuria    ED Discharge Orders        Ordered    [place] LT LOWER EXTREM LTD SOFT TISSUE NON VASCULAR       06/24/17 1723    US Venous  Img Lower Unilateral Left     06/24/17 1723     1. possible diagnoses reviewed with patient 2.Recommend ultrasound as per orders above; patient will have done today 3. Further recommendations pending [place] results   Controlled Substance Prescriptions [place] Controlled Substance Registry consulted? Not Applicable   Norval Gable, MD 06/24/17 2013

## 2017-06-24 NOTE — ED Triage Notes (Signed)
Patient c/o left lower abdominal pain that started early this morning and pain radiates that down his left leg.  Patient reports falling before Thanksgiving off a ladder.  Patient concern if he has a blood clot.

## 2017-06-24 NOTE — Telephone Encounter (Signed)
Discussed ultrasound results with patient; will treat empirically with oral antibiotic and recommended to patient to follow up with PCP in the next 1-2 weeks for recheck of resolution of inguinal lymph node enlargement

## 2017-07-01 ENCOUNTER — Encounter: Payer: Self-pay | Admitting: Family Medicine

## 2017-07-01 ENCOUNTER — Ambulatory Visit (INDEPENDENT_AMBULATORY_CARE_PROVIDER_SITE_OTHER): Payer: BLUE CROSS/BLUE SHIELD | Admitting: Family Medicine

## 2017-07-01 VITALS — BP 102/60 | HR 96 | Temp 98.0°F | Resp 16 | Ht 70.0 in | Wt 203.5 lb

## 2017-07-01 DIAGNOSIS — R1909 Other intra-abdominal and pelvic swelling, mass and lump: Secondary | ICD-10-CM | POA: Diagnosis not present

## 2017-07-01 NOTE — Progress Notes (Signed)
Name: Frank [name].   MRN: [id number]    DOB: 1961/01/20   Date:07/01/2017       Progress Note  Subjective  Chief Complaint  Chief Complaint  Patient presents with  . Follow-up    patient is here for a hospital f/u. patient states that his sx has not changed and has been unresponsive to medication.     HPI  Pt presents for ED follow up - seen in ED on 06/24/2017 for LEFT groin mass.  Was found to have an enlarged lymph node to the LEFT upper groin and was told to follow up with PCP. Since 06/24/2017 the area has gotten slightly bigger and pain is now radiating into the LLE and into the LEFT lateral hip area.  No LLE weakness, numbness, or tingling.  No hx of similar illness in the past. CBC and CMP from 06/17/2017 were normal.  [place] from 06/24/2017 - no DVT, subcutaneous lesion that radiologist deemed to likely be a lymph node but was unable to "definitively characterize" with [place].   Patient Active Problem List   Diagnosis Date Noted  . Frequent headaches 01/28/2016  . Erectile dysfunction of organic origin 09/16/2015  . Overweight (BMI 25.0-29.9) 05/15/2015  . Preventative health care 03/31/2015  . Hypothyroidism 03/31/2015  . Back pain 03/31/2015  . BPH with obstruction/lower urinary tract symptoms 03/22/2015  . Hypogonadism in male 03/22/2015  . Abdominal pain, right upper quadrant 09/17/2013  . Kidney cysts 09/17/2013  . Kidney lump 09/17/2013    Social History   Tobacco Use  . Smoking status: Never Smoker  . Smokeless tobacco: Former Network engineer Use Topics  . Alcohol use: No    Alcohol/week: 0.0 oz     Current Outpatient Medications:  .  aspirin EC 81 MG tablet, Take 81 mg by mouth daily., Disp: , Rfl:  .  Cholecalciferol (VITAMIN D3) 1000 units CAPS, Take by mouth., Disp: , Rfl:  .  ciprofloxacin (CIPRO) 500 MG tablet, Take 1 tablet (500 mg total) by mouth every 12 (twelve) hours., Disp: 20 tablet, Rfl: 0 .  clomiPHENE (CLOMID) 50 MG tablet, Take 0.5 tablets (25  mg total) by mouth daily., Disp: 90 tablet, Rfl: 1 .  cyclobenzaprine (FLEXERIL) 10 MG tablet, Take 0.5-1 tablets (5-10 mg total) by mouth 3 (three) times daily as needed for muscle spasms., Disp: 20 tablet, Rfl: 0 .  HYDROcodone-acetaminophen (NORCO) 5-325 MG tablet, Take 1 tablet by mouth every 6 (six) hours as needed for up to 15 doses for severe pain., Disp: 15 tablet, Rfl: 0 .  levothyroxine (SYNTHROID, LEVOTHROID) 125 MCG tablet, Take 1 tablet (125 mcg total) by mouth daily before breakfast., Disp: 90 tablet, Rfl: 1 .  Multiple Vitamin (MULTIVITAMIN) tablet, Take 1 tablet by mouth daily., Disp: , Rfl:  .  naproxen sodium (ANAPROX) 220 MG tablet, Take 220 mg by mouth 2 (two) times daily with a meal., Disp: , Rfl:  .  prednisoLONE acetate (PRED MILD) 0.12 % ophthalmic suspension, Place 1 drop into the right eye 2 (two) times daily., Disp: , Rfl:  .  sertraline (ZOLOFT) 50 MG tablet, Take 50 mg by mouth daily. , Disp: , Rfl:  .  tamsulosin (FLOMAX) 0.4 MG CAPS capsule, Take 1 capsule (0.4 mg total) by mouth daily., Disp: 90 capsule, Rfl: 3  No Known Allergies  ROS  Constitutional: Negative for fever/chills, night sweats, or weight change.  Respiratory: Negative for shortness of breath.  Ongoing cough for 3-4 weeks on and  off. Cardiovascular: Negative for chest pain or palpitations.  Gastrointestinal: Some abdominal pain in LLQ radiating up from the right groin mass; no bowel changes.  Musculoskeletal: Negative for gait problem or joint swelling.  Skin: Negative for rash.  Neurological: Negative for dizziness; endorses chronic headaches - [facility].  No other specific complaints in a complete review of systems (except as listed in HPI above).  Objective  Vitals:   07/01/17 1352 07/01/17 1447  BP: 102/60   Pulse: (!) 102 96  Resp: 16   Temp: 98 F (36.7 C)   TempSrc: Oral   SpO2: 96%   Weight: 203 lb 8 oz (92.3 kg)   Height: 5' 10" (1.778 m)    Body mass index is  29.2 kg/m.  Nursing Note and Vital Signs reviewed. Auscultated rate of 96bpm.  Physical Exam  Constitutional: Patient appears well-developed and well-nourished. No distress.  HEENT: head atraumatic, normocephalic, neck supple without lymphadenopathy, oropharynx pink and moist without exudate Cardiovascular: Normal rate, regular rhythm, S1/S2 present.  No murmur or rub heard. No BLE edema. Pulmonary/Chest: Effort normal and breath sounds clear. No respiratory distress or retractions. Abdominal: Soft and mild LLQ tenderness, bowel sounds present x4 quadrants.  Poorly defined firm mass palpable to the LEFT upper groin.  Mild tenderness to the area, no erythema, ecchymosis, or fluctuance. Psychiatric: Patient has a normal mood and affect. behavior is normal. Judgment and thought content normal.  Recent Results (from the past 2160 hour(s))  CBC with Differential/Platelet     Status: None   Collection Time: 04/11/17 12:22 PM  Result Value Ref Range   WBC 7.5 3.8 - 10.8 Thousand/uL   RBC 5.09 4.20 - 5.80 Million/uL   Hemoglobin 14.9 13.2 - 17.1 g/dL   HCT 45.3 38.5 - 50.0 %   MCV 89.0 80.0 - 100.0 fL   MCH 29.3 27.0 - 33.0 pg   MCHC 32.9 32.0 - 36.0 g/dL   RDW 12.7 11.0 - 15.0 %   Platelets 231 140 - 400 Thousand/uL   MPV 9.4 7.5 - 12.5 fL   Neutro Abs 3,983 1,500 - 7,800 cells/uL   Lymphs Abs 2,753 850 - 3,900 cells/uL   WBC mixed population 570 200 - 950 cells/uL   Eosinophils Absolute 173 15 - 500 cells/uL   Basophils Absolute 23 0 - 200 cells/uL   Neutrophils Relative % 53.1 %   Total Lymphocyte 36.7 %   Monocytes Relative 7.6 %   Eosinophils Relative 2.3 %   Basophils Relative 0.3 %  Lipid panel     Status: Abnormal   Collection Time: 04/11/17 12:22 PM  Result Value Ref Range   Cholesterol 224 (H) <200 mg/dL   HDL 46 >40 mg/dL   Triglycerides 121 <150 mg/dL   LDL Cholesterol (Calc) 154 (H) mg/dL (calc)    Comment: Reference range: <100 . Desirable range <100 mg/dL for  primary prevention;   <70 mg/dL for patients with CHD or diabetic patients  with > or = 2 CHD risk factors. Marland Kitchen LDL-C is now calculated using the Martin-Hopkins  calculation, which is a validated novel method providing  better accuracy than the Friedewald equation in the  estimation of LDL-C.  Cresenciano Genre et al. Annamaria Helling. 1749;449(67): 2061-2068  (http://education.[facility].com/faq/FAQ164)    Total CHOL/HDL Ratio 4.9 <5.0 (calc)   Non-HDL Cholesterol (Calc) 178 (H) <130 mg/dL (calc)    Comment: For patients with diabetes plus 1 major ASCVD risk  factor, treating to a non-HDL-C goal of <  100 mg/dL  (LDL-C of <70 mg/dL) is considered a therapeutic  option.   TSH     Status: None   Collection Time: 04/11/17 12:22 PM  Result Value Ref Range   TSH 0.99 0.40 - 4.50 mIU/L  COMPLETE METABOLIC PANEL WITH GFR     Status: Abnormal   Collection Time: 04/11/17 12:22 PM  Result Value Ref Range   Glucose, Bld 108 (H) 65 - 99 mg/dL    Comment: .            Fasting reference interval . For someone without known diabetes, a glucose value between 100 and 125 mg/dL is consistent with prediabetes and should be confirmed with a follow-up test. .    BUN 15 7 - 25 mg/dL   Creat 1.30 0.70 - 1.33 mg/dL    Comment: For patients >55 years of age, the reference limit for Creatinine is approximately 13% higher for people identified as African-American. .    GFR, Est Non African American 62 > OR = 60 mL/min/1.90m   GFR, Est African American 72 > OR = 60 mL/min/1.780m  BUN/Creatinine Ratio NOT APPLICABLE 6 - 22 (calc)   Sodium 137 135 - 146 mmol/L   Potassium 4.7 3.5 - 5.3 mmol/L   Chloride 103 98 - 110 mmol/L   CO2 26 20 - 32 mmol/L   Calcium 9.2 8.6 - 10.3 mg/dL   Total Protein 6.5 6.1 - 8.1 g/dL   Albumin 4.1 3.6 - 5.1 g/dL   Globulin 2.4 1.9 - 3.7 g/dL (calc)   AG Ratio 1.7 1.0 - 2.5 (calc)   Total Bilirubin 0.9 0.2 - 1.2 mg/dL   Alkaline phosphatase (APISO) 52 40 - 115 U/L   AST 15 10 -  35 U/L   ALT 14 9 - 46 U/L  Urinalysis, Complete w Microscopic     Status: Abnormal   Collection Time: 06/13/17  2:30 PM  Result Value Ref Range   Color, Urine COLORLESS (A) YELLOW   APPearance CLEAR (A) CLEAR   Specific Gravity, Urine 1.003 (L) 1.005 - 1.030   pH 6.0 5.0 - 8.0   Glucose, UA NEGATIVE NEGATIVE mg/dL   Hgb urine dipstick NEGATIVE NEGATIVE   Bilirubin Urine NEGATIVE NEGATIVE   Ketones, ur NEGATIVE NEGATIVE mg/dL   Protein, ur NEGATIVE NEGATIVE mg/dL   Nitrite NEGATIVE NEGATIVE   Leukocytes, UA NEGATIVE NEGATIVE   RBC / HPF NONE SEEN 0 - 5 RBC/hpf   WBC, UA 0-5 0 - 5 WBC/hpf   Bacteria, UA NONE SEEN NONE SEEN   Squamous Epithelial / LPF NONE SEEN NONE SEEN  POCT urinalysis dipstick     Status: Normal   Collection Time: 06/17/17 11:45 AM  Result Value Ref Range   Color, UA yellow    Clarity, UA clear    Glucose, UA negative    Bilirubin, UA negative    Ketones, UA negative    Spec Grav, UA 1.015 1.010 - 1.025   Blood, UA negative    pH, UA 6.0 5.0 - 8.0   Protein, UA negative    Urobilinogen, UA 0.2 0.2 or 1.0 E.U./dL   Nitrite, UA negative    Leukocytes, UA Negative Negative  CBC with Differential/Platelet     Status: None   Collection Time: 06/17/17 12:42 PM  Result Value Ref Range   WBC 8.7 3.8 - 10.6 K/uL   RBC 4.72 4.40 - 5.90 MIL/uL   Hemoglobin 14.1 13.0 - 18.0 g/dL   HCT 41.1  40.0 - 52.0 %   MCV 87.1 80.0 - 100.0 fL   MCH 29.8 26.0 - 34.0 pg   MCHC 34.3 32.0 - 36.0 g/dL   RDW 13.2 11.5 - 14.5 %   Platelets 273 150 - 440 K/uL   Neutrophils Relative % 56 %   Neutro Abs 4.9 1.4 - 6.5 K/uL   Lymphocytes Relative 31 %   Lymphs Abs 2.7 1.0 - 3.6 K/uL   Monocytes Relative 9 %   Monocytes Absolute 0.7 0.2 - 1.0 K/uL   Eosinophils Relative 3 %   Eosinophils Absolute 0.2 0 - 0.7 K/uL   Basophils Relative 1 %   Basophils Absolute 0.0 0 - 0.1 K/uL  Comprehensive metabolic panel     Status: Abnormal   Collection Time: 06/17/17 12:42 PM  Result Value  Ref Range   Sodium 139 135 - 145 mmol/L   Potassium 4.0 3.5 - 5.1 mmol/L   Chloride 104 101 - 111 mmol/L   CO2 24 22 - 32 mmol/L   Glucose, Bld 98 65 - 99 mg/dL   BUN 15 6 - 20 mg/dL   Creatinine, Ser 1.25 (H) 0.61 - 1.24 mg/dL   Calcium 9.1 8.9 - 10.3 mg/dL   Total Protein 7.0 6.5 - 8.1 g/dL   Albumin 4.2 3.5 - 5.0 g/dL   AST 22 15 - 41 U/L   ALT 23 17 - 63 U/L   Alkaline Phosphatase 74 38 - 126 U/L   Total Bilirubin 0.6 0.3 - 1.2 mg/dL   GFR calc non Af Amer >60 >60 mL/min   GFR calc Af Amer >60 >60 mL/min    Comment: (NOTE) The eGFR has been calculated using the CKD EPI equation. This calculation has not been validated in all clinical situations. eGFR's persistently <60 mL/min signify possible Chronic Kidney Disease.    Anion gap 11 5 - 15     Assessment & Plan  1. Left groin mass - Ambulatory referral to General Surgery - Advised pt to monitor area, but to refrain from palpating daily as this may agitate and enlarge node further.  -Red flags and when to present for emergency care or RTC including fever >101.36F, chest pain, shortness of breath, new/worsening/un-resolving symptoms, reviewed with patient at time of visit. Follow up and care instructions discussed and provided in AVS.

## 2017-07-13 ENCOUNTER — Encounter: Payer: Self-pay | Admitting: General Surgery

## 2017-07-13 ENCOUNTER — Ambulatory Visit (INDEPENDENT_AMBULATORY_CARE_PROVIDER_SITE_OTHER): Payer: BLUE CROSS/BLUE SHIELD | Admitting: General Surgery

## 2017-07-13 VITALS — BP 112/78 | HR 90 | Resp 13 | Ht 70.0 in | Wt 201.0 lb

## 2017-07-13 DIAGNOSIS — R1909 Other intra-abdominal and pelvic swelling, mass and lump: Secondary | ICD-10-CM

## 2017-07-13 NOTE — Patient Instructions (Addendum)
May use heat to the area. Use Aleve 2 pills twice a day for 10 days. Follow up in about 10 days.  May use OTC Zantac or Prilosec if needed for reflux

## 2017-07-13 NOTE — Progress Notes (Signed)
Patient ID: Frank Reggio., male   DOB: [birth year], 56 y.o.   MRN: [id number]  Chief Complaint  Patient presents with  . Mass    HPI Frank [name]. is a 56 y.o. male.  Here for evaluation of a left groin mass referred by Raelyn Ensign FNP. He states the ara is painful. He is having thigh pain as well, occasionally in his calf. He did have a fall from a ladder 06-06-17, landed flat on his back. He states it has been there since the fall. The knot has gotten larger but after the antibiotics(Cipro) is does seem to be smaller. He did go to [facility] 06-24-17. Ultrasound was 06-24-17 to rule out DVT. CT cervical spine on 06-13-17.  In reviewing his fall, he fell about 10 feet onto his back.  No loss of consciousness.  He is a Patent examiner for [employer].  He is here with his wife, Frank [name] of 14 years.  HPI  Past Medical History:  Diagnosis Date  . Abdominal pain, right upper quadrant 09/17/2013  . Acid reflux   . Anxiety and depression   . Back pain 03/31/2015  . BPH (benign prostatic hyperplasia)   . BPH with obstruction/lower urinary tract symptoms 03/22/2015  . Frequency   . Hypogonadism in male   . Hypothyroidism   . Kidney cysts 09/17/2013  . Kidney lump 09/17/2013  . Over weight   . Overweight (BMI 25.0-29.9) 05/15/2015  . Preventative health care 03/31/2015  . Sleep apnea   . Testicular hypofunction   . Weak urinary stream     Past Surgical History:  Procedure Laterality Date  . COLONOSCOPY  2014   Dr Allen Norris    Family History  Problem Relation Age of Onset  . Stroke Mother   . Cancer Mother        breast  . Migraines Mother   . Breast cancer Mother   . Heart disease Unknown        uncle  . Congestive Heart Failure Maternal Grandmother   . Cancer Paternal Grandmother        throat  . Skin cancer Maternal Grandfather   . Prostate cancer Paternal Uncle   . Kidney disease Neg Hx   . Kidney cancer Neg Hx   . Bladder Cancer Neg Hx     Social  History Social History   Tobacco Use  . Smoking status: Never Smoker  . Smokeless tobacco: Former Network engineer Use Topics  . Alcohol use: No    Alcohol/week: 0.0 oz  . Drug use: No    No Known Allergies  Current Outpatient Medications  Medication Sig Dispense Refill  . aspirin EC 81 MG tablet Take 81 mg by mouth daily.    . Cholecalciferol (VITAMIN D3) 1000 units CAPS Take by mouth.    . clomiPHENE (CLOMID) 50 MG tablet Take 0.5 tablets (25 mg total) by mouth daily. 90 tablet 1  . cyclobenzaprine (FLEXERIL) 10 MG tablet Take 0.5-1 tablets (5-10 mg total) by mouth 3 (three) times daily as needed for muscle spasms. 20 tablet 0  . levothyroxine (SYNTHROID, LEVOTHROID) 125 MCG tablet Take 1 tablet (125 mcg total) by mouth daily before breakfast. 90 tablet 1  . Multiple Vitamin (MULTIVITAMIN) tablet Take 1 tablet by mouth daily.    . naproxen sodium (ANAPROX) 220 MG tablet Take 220 mg by mouth 2 (two) times daily with a meal.    . sertraline (ZOLOFT) 50 MG tablet Take 50  mg by mouth daily.     . tamsulosin (FLOMAX) 0.4 MG CAPS capsule Take 1 capsule (0.4 mg total) by mouth daily. 90 capsule 3   No current facility-administered medications for this visit.     Review of Systems Review of Systems  Constitutional: Negative.   Respiratory: Negative.   Cardiovascular: Negative.   Gastrointestinal: Negative for abdominal pain, constipation and diarrhea.    Blood pressure 112/78, pulse 90, resp. rate 13, height 5\' 10"  (1.778 m), weight 201 lb (91.2 kg).  Physical Exam Physical Exam  Constitutional: He is oriented to person, place, and time. He appears well-developed and well-nourished.  HENT:  Mouth/Throat: Oropharynx is clear and moist. No oropharyngeal exudate.  Eyes: Conjunctivae are normal. No scleral icterus.  Neck: Neck supple.  Cardiovascular: Normal rate, regular rhythm, normal heart sounds and intact distal pulses.  Pulses:      Femoral pulses are 2+ on the right side,  and 2+ on the left side. No lower leg edema  Pulmonary/Chest: Effort normal and breath sounds normal.  Abdominal: Soft. Normal appearance. There is no hepatosplenomegaly. There is no tenderness.  Genitourinary:     Lymphadenopathy:    He has no cervical adenopathy.    He has no axillary adenopathy.       Right: No inguinal adenopathy present.       Left: No inguinal adenopathy present.  Neurological: He is alert and oriented to person, place, and time.  Skin: Skin is warm and dry.  Psychiatric: His behavior is normal.    Data Reviewed CT of June 17, 2017 reviewed.  Soft tissue mass corresponding to the ultrasound findings of June 24, 2017 identified, not mentioned in the formal report.  Less than 2 cm in diameter.  Venous ultrasound of June 24, 2017 reviewed.  No evidence of DVT.  CT of the cervical spine dated June 13, 2017 report reviewed.  No acute findings.  Assessment    Soft tissue mass likely related to the 10 foot fall from a ladder, unrelated to lymphadenopathy.    Plan    The patient's mother died earlier this year with breast cancer and she had metastatic disease to the axilla and later developed brain metastases with a rapid progression to death.  With the mention of lymph nodes on the soft tissue ultrasound of June 24, 7034 he is certainly concerned about possible occult cancer.  There is no other palpable lymphadenopathy, no "B" symptoms to suggest lymphoma and clinical exam is that of a soft tissue mass rather than a lymph node.  Prior to proceeding to excision he is amenable to a short trial of anti-inflammatories and local heat.  If the area is not improved in 10-14 days we will proceed to excision.    May use heat to the area. Use Aleve 2 pills twice a day for 10 days. Follow up in about 10 days.  May use OTC Zantac or Prilosec if needed for reflux  HPI, Physical Exam, Assessment and Plan have been scribed under the direction and in the  presence of Robert Bellow, MD. Karie Fetch, RN  I have completed the exam and reviewed the above documentation for accuracy and completeness.  I agree with the above.  Haematologist has been used and any errors in dictation or transcription are unintentional.  Hervey Ard, M.D., F.A.C.S.  Robert Bellow 07/13/2017, 9:28 PM

## 2017-07-21 ENCOUNTER — Ambulatory Visit: Payer: BLUE CROSS/BLUE SHIELD | Admitting: Urology

## 2017-07-26 ENCOUNTER — Encounter: Payer: Self-pay | Admitting: General Surgery

## 2017-07-26 ENCOUNTER — Ambulatory Visit (INDEPENDENT_AMBULATORY_CARE_PROVIDER_SITE_OTHER): Payer: BLUE CROSS/BLUE SHIELD | Admitting: General Surgery

## 2017-07-26 VITALS — BP 136/70 | HR 80 | Resp 10 | Ht 70.0 in | Wt 201.0 lb

## 2017-07-26 DIAGNOSIS — R1909 Other intra-abdominal and pelvic swelling, mass and lump: Secondary | ICD-10-CM | POA: Diagnosis not present

## 2017-07-26 NOTE — Progress Notes (Signed)
Patient ID: Frank Dorfman., male   DOB: Mar 15, 1961, 57 y.o.   MRN: [id number]  Chief Complaint  Patient presents with  . Follow-up    HPI Frank [name]. is a 57 y.o. male.  Here for his 2 week follow up left groin pain. He states the knot remains the same. He states the pain is lower now and bilateral sides of his thigh. Denies swelling in the thigh.  He is here with his wife, Frank [name]    HPI  Past Medical History:  Diagnosis Date  . Abdominal pain, right upper quadrant 09/17/2013  . Acid reflux   . Anxiety and depression   . Back pain 03/31/2015  . BPH (benign prostatic hyperplasia)   . BPH with obstruction/lower urinary tract symptoms 03/22/2015  . Frequency   . Hypogonadism in male   . Hypothyroidism   . Kidney cysts 09/17/2013  . Kidney lump 09/17/2013  . Over weight   . Overweight (BMI 25.0-29.9) 05/15/2015  . Preventative health care 03/31/2015  . Sleep apnea   . Testicular hypofunction   . Weak urinary stream     Past Surgical History:  Procedure Laterality Date  . COLONOSCOPY  2014   Dr Allen Norris    Family History  Problem Relation Age of Onset  . Stroke Mother   . Cancer Mother        breast  . Migraines Mother   . Breast cancer Mother   . Heart disease Unknown        uncle  . Congestive Heart Failure Maternal Grandmother   . Cancer Paternal Grandmother        throat  . Skin cancer Maternal Grandfather   . Prostate cancer Paternal Uncle   . Kidney disease Neg Hx   . Kidney cancer Neg Hx   . Bladder Cancer Neg Hx     Social History Social History   Tobacco Use  . Smoking status: Never Smoker  . Smokeless tobacco: Former Network engineer Use Topics  . Alcohol use: No    Alcohol/week: 0.0 oz  . Drug use: No    No Known Allergies  Current Outpatient Medications  Medication Sig Dispense Refill  . aspirin EC 81 MG tablet Take 81 mg by mouth daily.    . Cholecalciferol (VITAMIN D3) 1000 units CAPS Take by mouth.    . clomiPHENE (CLOMID) 50  MG tablet Take 0.5 tablets (25 mg total) by mouth daily. 90 tablet 1  . cyclobenzaprine (FLEXERIL) 10 MG tablet Take 0.5-1 tablets (5-10 mg total) by mouth 3 (three) times daily as needed for muscle spasms. 20 tablet 0  . levothyroxine (SYNTHROID, LEVOTHROID) 125 MCG tablet Take 1 tablet (125 mcg total) by mouth daily before breakfast. 90 tablet 1  . Multiple Vitamin (MULTIVITAMIN) tablet Take 1 tablet by mouth daily.    . naproxen sodium (ANAPROX) 220 MG tablet Take 220 mg by mouth 2 (two) times daily with a meal.    . sertraline (ZOLOFT) 50 MG tablet Take 50 mg by mouth daily.     . tamsulosin (FLOMAX) 0.4 MG CAPS capsule Take 1 capsule (0.4 mg total) by mouth daily. 90 capsule 3   No current facility-administered medications for this visit.     Review of Systems Review of Systems  Constitutional: Negative.   Respiratory: Negative.   Cardiovascular: Negative.     Blood pressure 136/70, pulse 80, resp. rate 10, height 5\' 10"  (1.778 m), weight 201 lb (91.2 kg), SpO2  98 %.  Physical Exam Physical Exam  Constitutional: He is oriented to person, place, and time. He appears well-developed and well-nourished.  Cardiovascular: Intact distal pulses.  Pulses:      Femoral pulses are 2+ on the right side, and 2+ on the left side.      Popliteal pulses are 2+ on the right side, and 2+ on the left side.  Abdominal: Soft. Normal appearance. There is no tenderness. Hernia confirmed negative in the right inguinal area and confirmed negative in the left inguinal area.  Small nodule left groin  Genitourinary:     Musculoskeletal:       Legs: Tender left adductor insertion  Lymphadenopathy:       Right: No inguinal adenopathy present.       Left: No inguinal adenopathy present.  Neurological: He is alert and oriented to person, place, and time. He has normal strength.  Normal motor strength testing in all groups of the lower extremity.  Skin: Skin is warm and dry.  Psychiatric: His behavior  is normal.    Data Reviewed Prior imaging studies from November 2018.  Assessment    Left lower quadrant soft tissue nodule, stable, less tender.  Deep bony discomfort in the left femur, intermittent, not related to physical activity.    Plan    Considering the jarring fall he experienced onto his back in November, I think this is likely responsible for the discomfort in the musculature of the left thigh, although no direct trauma to this area occurred.  He thinks that the anti-inflammatories of help, but at this point it is reasonable to discontinue them to see if he continues to improve without medication.  Regarding the left groin nodule which initially was of great concern for him regards the possibility of lymphoma, this is less a concern at this time and while offered the opportunity for excision he is going to put this on hold.     Discussed observation vs excision May do a trial stop of the anti inflammatory to see effects. The patient is aware to use a heating pad as needed for comfort.  Follow up as needed.  HPI, Physical Exam, Assessment and Plan have been scribed under the direction and in the presence of Robert Bellow, MD. Karie Fetch, RN   Robert Bellow 07/26/2017, 5:21 PM

## 2017-07-26 NOTE — Patient Instructions (Addendum)
The patient is aware to call back for any questions or new concerns.  May do a trial stop of the anti inflammatory to see effects. The patient is aware to use a heating pad as needed for comfort.

## 2017-08-11 ENCOUNTER — Ambulatory Visit (INDEPENDENT_AMBULATORY_CARE_PROVIDER_SITE_OTHER): Payer: BLUE CROSS/BLUE SHIELD

## 2017-08-11 DIAGNOSIS — Z23 Encounter for immunization: Secondary | ICD-10-CM | POA: Diagnosis not present

## 2017-08-18 DIAGNOSIS — Z713 Dietary counseling and surveillance: Secondary | ICD-10-CM | POA: Diagnosis not present

## 2017-09-09 ENCOUNTER — Other Ambulatory Visit: Payer: BLUE CROSS/BLUE SHIELD

## 2017-09-09 DIAGNOSIS — N138 Other obstructive and reflux uropathy: Secondary | ICD-10-CM

## 2017-09-09 DIAGNOSIS — N401 Enlarged prostate with lower urinary tract symptoms: Secondary | ICD-10-CM | POA: Diagnosis not present

## 2017-09-09 DIAGNOSIS — E349 Endocrine disorder, unspecified: Secondary | ICD-10-CM | POA: Diagnosis not present

## 2017-09-10 LAB — HEPATIC FUNCTION PANEL
ALT: 17 IU/L (ref 0–44)
AST: 18 IU/L (ref 0–40)
Albumin: 4 g/dL (ref 3.5–5.5)
Alkaline Phosphatase: 65 IU/L (ref 39–117)
BILIRUBIN TOTAL: 0.6 mg/dL (ref 0.0–1.2)
BILIRUBIN, DIRECT: 0.12 mg/dL (ref 0.00–0.40)
Total Protein: 6.4 g/dL (ref 6.0–8.5)

## 2017-09-10 LAB — HEMOGLOBIN: HEMOGLOBIN: 14.6 g/dL (ref 13.0–17.7)

## 2017-09-10 LAB — PSA: Prostate Specific Ag, Serum: 0.4 ng/mL (ref 0.0–4.0)

## 2017-09-10 LAB — TESTOSTERONE: Testosterone: 745 ng/dL (ref 264–916)

## 2017-09-10 LAB — HEMATOCRIT: HEMATOCRIT: 44.4 % (ref 37.5–51.0)

## 2017-09-12 ENCOUNTER — Other Ambulatory Visit: Payer: BLUE CROSS/BLUE SHIELD

## 2017-09-14 ENCOUNTER — Encounter: Payer: Self-pay | Admitting: Family Medicine

## 2017-09-14 ENCOUNTER — Encounter: Payer: Self-pay | Admitting: Urology

## 2017-09-14 ENCOUNTER — Ambulatory Visit (INDEPENDENT_AMBULATORY_CARE_PROVIDER_SITE_OTHER): Payer: BLUE CROSS/BLUE SHIELD | Admitting: Urology

## 2017-09-14 VITALS — BP 112/73 | HR 74 | Ht 70.0 in | Wt 201.0 lb

## 2017-09-14 DIAGNOSIS — M25552 Pain in left hip: Secondary | ICD-10-CM | POA: Diagnosis not present

## 2017-09-14 DIAGNOSIS — N401 Enlarged prostate with lower urinary tract symptoms: Secondary | ICD-10-CM | POA: Diagnosis not present

## 2017-09-14 DIAGNOSIS — N529 Male erectile dysfunction, unspecified: Secondary | ICD-10-CM

## 2017-09-14 DIAGNOSIS — N138 Other obstructive and reflux uropathy: Secondary | ICD-10-CM

## 2017-09-14 DIAGNOSIS — E349 Endocrine disorder, unspecified: Secondary | ICD-10-CM | POA: Diagnosis not present

## 2017-09-14 MED ORDER — TAMSULOSIN HCL 0.4 MG PO CAPS: 0.4000 mg | ORAL_CAPSULE | Freq: Every day | ORAL | 3 refills | Status: DC

## 2017-09-14 MED ORDER — SILDENAFIL CITRATE 20 MG PO TABS: ORAL_TABLET | ORAL | 3 refills | Status: DC

## 2017-09-14 MED ORDER — CLOMIPHENE CITRATE 50 MG PO TABS: 25.0000 mg | ORAL_TABLET | Freq: Every day | ORAL | 3 refills | Status: DC

## 2017-09-14 NOTE — Progress Notes (Signed)
8:29 AM   Frank [name]. June 07, 1961 [id number]  Referring provider: Arnetha Courser, MD [address] [place], [place] [address]  Chief Complaint  Patient presents with  . Follow-up    HPI: Frank [name] is a 57 year old Caucasian male with testosterone deficiency, erectile dysfunction and BPH with LUTS who presents today for a 6 months follow-up  Testosterone deficiency He is no longer having spontaneous erections at night.   He has sleep apnea and is sleeping with a CPAP machine.  His current testosterone level is 745 ng/dL on 09/09/2017.  He is currently managing his hypogonadism with Clomid 50 mg, 1/2 tablet daily.  His HCT and LFT's are normal.    BPH WITH LUTS His IPSS score today is 7, which is mild lower urinary tract symptomatology.  He is mostly satisfied with his quality life due to his urinary symptoms.  His previous IPSS score was 4/1.  He is complaining of a weak stream.  He denies any dysuria, hematuria or suprapubic pain.  He currently taking tamsulosin 0.4 mg daily.  He also denies any recent fevers, chills, nausea or vomiting.   He does not have a family history of PCa. IPSS    Row Name 09/14/17 0800         International Prostate Symptom Score   How often have you had the sensation of not emptying your bladder?  Less than 1 in 5     How often have you had to urinate less than every two hours?  Less than 1 in 5 times     How often have you found you stopped and started again several times when you urinated?  Less than 1 in 5 times     How often have you found it difficult to postpone urination?  Less than 1 in 5 times     How often have you had a weak urinary stream?  Less than half the time     How often have you had to strain to start urination?  Not at All     How many times did you typically get up at night to urinate?  1 Time     Total IPSS Score  7       Quality of Life due to urinary symptoms   If you were to spend the rest of your life  with your urinary condition just the way it is now how would you feel about that?  Mostly Satisfied        Score:  1-7 Mild 8-19 Moderate 20-35 Severe  Erectile dysfunction His SHIM score is 22, which is no ED.   His previous SHIM score was 24.  He has been having difficulty with erections for the last few years.   His major complaint is achieving an erection.  His libido is diminished.   His risk factors for ED are age, BPH, testosterone deficiency, hypothyroidism, sleep apnea and depression .  He denies any painful erections or curvatures with his erections.   He states his erectile dysfunction is worsening.  He would like to have a script for sildenafil on hand.   SHIM    Row Name 09/14/17 [phone]         SHIM: Over the last 6 months:   How do you rate your confidence that you could get and keep an erection?  Moderate     When you had erections with sexual stimulation, how often were your erections  hard enough for penetration (entering your partner)?  Most Times (much more than half the time)     During sexual intercourse, how often were you able to maintain your erection after you had penetrated (entered) your partner?  Almost Always or Always     During sexual intercourse, how difficult was it to maintain your erection to completion of intercourse?  Not Difficult     When you attempted sexual intercourse, how often was it satisfactory for you?  Almost Always or Always       SHIM Total Score   SHIM  22        Score: 1-7 Severe ED 8-11 Moderate ED 12-16 Mild-Moderate ED 17-21 Mild ED 22-25 No ED  Patient had a fall in December 2018 where he landed flat on his back.  He had a CT scan in the ED which noted no cause of the clinical presentation is identified. The patient does have a few tiny calcifications in the kidneys, none larger than 1 mm, but there is no evidence of passing stone or hydronephrosis.  No stone in the bladder. No traumatic finding visible related to the  described  fall 1 week ago.   He had an enlarged lymph node in his left groin and has been evaluated by general surgery.  He still continues to have the left hip pain. He is very anxious as his mother just passed away from breast cancer and he is fearful this is a cancer.   PMH: Past Medical History:  Diagnosis Date  . Abdominal pain, right upper quadrant 09/17/2013  . Acid reflux   . Anxiety and depression   . Back pain 03/31/2015  . BPH (benign prostatic hyperplasia)   . BPH with obstruction/lower urinary tract symptoms 03/22/2015  . Frequency   . Hypogonadism in male   . Hypothyroidism   . Kidney cysts 09/17/2013  . Kidney lump 09/17/2013  . Over weight   . Overweight (BMI 25.0-29.9) 05/15/2015  . Preventative health care 03/31/2015  . Sleep apnea   . Testicular hypofunction   . Weak urinary stream     Surgical History: Past Surgical History:  Procedure Laterality Date  . COLONOSCOPY  2014   Dr Allen Norris    Home Medications:  Allergies as of 09/14/2017   No Known Allergies     Medication List        Accurate as of 09/14/17  8:29 AM. Always use your most recent med list.          aspirin EC 81 MG tablet Take 81 mg by mouth daily.   clomiPHENE 50 MG tablet Commonly known as:  CLOMID Take 0.5 tablets (25 mg total) by mouth daily.   cyclobenzaprine 10 MG tablet Commonly known as:  FLEXERIL Take 0.5-1 tablets (5-10 mg total) by mouth 3 (three) times daily as needed for muscle spasms.   levothyroxine 125 MCG tablet Commonly known as:  SYNTHROID, LEVOTHROID Take 1 tablet (125 mcg total) by mouth daily before breakfast.   multivitamin tablet Take 1 tablet by mouth daily.   naproxen sodium 220 MG tablet Commonly known as:  ALEVE Take 220 mg by mouth 2 (two) times daily with a meal.   sertraline 50 MG tablet Commonly known as:  ZOLOFT Take 50 mg by mouth daily.   tamsulosin 0.4 MG Caps capsule Commonly known as:  FLOMAX Take 1 capsule (0.4 mg total) by mouth daily.   Vitamin D3  1000 units Caps Take by mouth.  Allergies: No Known Allergies  Family History: Family History  Problem Relation Age of Onset  . Stroke Mother   . Cancer Mother        breast  . Migraines Mother   . Breast cancer Mother   . Heart disease Unknown        uncle  . Congestive Heart Failure Maternal Grandmother   . Cancer Paternal Grandmother        throat  . Skin cancer Maternal Grandfather   . Prostate cancer Paternal Uncle   . Kidney disease Neg Hx   . Kidney cancer Neg Hx   . Bladder Cancer Neg Hx     Social History:  reports that  has never smoked. He quit smokeless tobacco use about 32 years ago. He reports that he does not drink alcohol or use drugs.  ROS: UROLOGY Frequent Urination?: No Hard to postpone urination?: No Burning/pain with urination?: No Get up at night to urinate?: No Leakage of urine?: No Urine stream starts and stops?: No Trouble starting stream?: No Do you have to strain to urinate?: No Blood in urine?: No Urinary tract infection?: No Sexually transmitted disease?: No Injury to kidneys or bladder?: No Painful intercourse?: No Weak stream?: Yes Erection problems?: No Penile pain?: No  Gastrointestinal Nausea?: No Vomiting?: No Indigestion/heartburn?: No Diarrhea?: No Constipation?: No  Constitutional Fever: No Night sweats?: No Weight loss?: No Fatigue?: Yes  Skin Skin rash/lesions?: No Itching?: No  Eyes Blurred vision?: No Double vision?: No  Ears/Nose/Throat Sore throat?: No Sinus problems?: No  Hematologic/Lymphatic Swollen glands?: No Easy bruising?: No  Cardiovascular Leg swelling?: No Chest pain?: No  Respiratory Cough?: No Shortness of breath?: No  Endocrine Excessive thirst?: No  Musculoskeletal Back pain?: Yes Joint pain?: No  Neurological Headaches?: No Dizziness?: No  Psychologic Depression?: Yes Anxiety?: Yes  Physical Exam: BP 112/73   Pulse 74   Ht 5\' 10"  (1.778 m)   Wt 201  lb (91.2 kg)   BMI 28.84 kg/m   Constitutional: Well nourished. Alert and oriented, No acute distress. HEENT: [place] AT, moist mucus membranes. Trachea midline, no masses. Cardiovascular: No clubbing, cyanosis, or edema. Respiratory: Normal respiratory effort, no increased work of breathing. GI: Abdomen is soft, non tender, non distended, no abdominal masses. Liver and spleen not palpable.  No hernias appreciated.  Stool sample for occult testing is not indicated.   GU: No CVA tenderness.  No bladder fullness or masses.  Patient with circumcised phallus.  Urethral meatus is patent.  No penile discharge. No penile lesions or rashes. Scrotum without lesions, cysts, rashes and/or edema.  Testicles are located scrotally bilaterally. No masses are appreciated in the testicles. Left and right epididymis are normal. Rectal: Patient with  normal sphincter tone. Anus and perineum without scarring or rashes. No rectal masses are appreciated. Prostate is approximately 50 grams, no nodules are appreciated. Seminal vesicles are normal. Skin: No rashes, bruises or suspicious lesions. Lymph: No cervical or inguinal adenopathy. Neurologic: Grossly intact, no focal deficits, moving all 4 extremities. Psychiatric: Normal mood and affect.   Laboratory Data: Lab Results  Component Value Date   WBC 8.7 06/17/2017   HGB 14.6 09/09/2017   HCT 44.4 09/09/2017   MCV 87.1 06/17/2017   PLT 273 06/17/2017    Lab Results  Component Value Date   CREATININE 1.25 (H) 06/17/2017    Lab Results  Component Value Date   TESTOSTERONE 745 09/09/2017    Results for orders placed or performed in visit  on 09/09/17  PSA  Result Value Ref Range   Prostate Specific Ag, Serum 0.4 0.0 - 4.0 ng/mL  Testosterone  Result Value Ref Range   Testosterone 745 264 - 916 ng/dL  Hematocrit  Result Value Ref Range   Hematocrit 44.4 37.5 - 51.0 %  Hemoglobin  Result Value Ref Range   Hemoglobin 14.6 13.0 - 17.7 g/dL  Hepatic  function panel  Result Value Ref Range   Total Protein 6.4 6.0 - 8.5 g/dL   Albumin 4.0 3.5 - 5.5 g/dL   Bilirubin Total 0.6 0.0 - 1.2 mg/dL   Bilirubin, Direct 0.12 0.00 - 0.40 mg/dL   Alkaline Phosphatase 65 39 - 117 IU/L   AST 18 0 - 40 IU/L   ALT 17 0 - 44 IU/L  Previous PSA's:        0.2 ng/mL on 05/30/2014       0.3 ng/mL on 09/12/2015  0.3 ng/mL on 08/11/2016  I have reviewed the labs   Assessment & Plan:    1. Testosterone deficiency  -most recent testosterone level is 745 ng/mL on 09/09/2017 (goal 450-600)  -continue Clomid 50 mg, 1/2 tablet daily; refills given  -RTC in 12 months for HCT, testosterone, PSA, LFT's, ADAM and exam  2. BPH with LUTS  - IPSS score is 7/1, it is worsening  - Continue conservative management, avoiding bladder irritants and timed voiding's  - Continue tamsulosin 0.4 mg daily; refills given  - RTC in 6 months for IPSS, PSA and exam, as testosterone therapy can cause prostate enlargement and worsen LUTS  3. Erectile dysfunction:     -SHIM score is 22, it is worsening  - script given for Sildenafil 20 mg, 3 to 5 tablets two hours prior to intercourse on an empty stomach, # 50; he is warned not to take medications that contain nitrates.  I also advised him of the side effects, such as: headache, flushing, dyspepsia, abnormal vision, nasal congestion, back pain, myalgia, nausea, dizziness, and rash.  -RTC in 6 months for SHIM score and exam, as testosterone therapy can affect erections  4. Left hip pain - refer to orthopedics for further evaluation  Return in about 1 year (around 09/14/2018) for IPSS, SHIM and exam, PSA, LFT's, HCT, HBG, testosterone (before 10 AM).  Zara Council, [facility] [address], [place], [place] [address] [phone]

## 2017-09-15 ENCOUNTER — Ambulatory Visit: Payer: BLUE CROSS/BLUE SHIELD | Admitting: Urology

## 2017-09-16 ENCOUNTER — Other Ambulatory Visit: Payer: BLUE CROSS/BLUE SHIELD

## 2017-09-21 DIAGNOSIS — M25552 Pain in left hip: Secondary | ICD-10-CM | POA: Diagnosis not present

## 2017-09-29 DIAGNOSIS — M25552 Pain in left hip: Secondary | ICD-10-CM | POA: Diagnosis not present

## 2017-10-11 DIAGNOSIS — M25552 Pain in left hip: Secondary | ICD-10-CM | POA: Diagnosis not present

## 2017-10-19 DIAGNOSIS — M1612 Unilateral primary osteoarthritis, left hip: Secondary | ICD-10-CM | POA: Diagnosis not present

## 2017-10-20 DIAGNOSIS — D72829 Elevated white blood cell count, unspecified: Secondary | ICD-10-CM | POA: Diagnosis not present

## 2017-10-20 DIAGNOSIS — E291 Testicular hypofunction: Secondary | ICD-10-CM | POA: Diagnosis not present

## 2017-11-02 DIAGNOSIS — M25552 Pain in left hip: Secondary | ICD-10-CM | POA: Diagnosis not present

## 2017-11-02 DIAGNOSIS — M1612 Unilateral primary osteoarthritis, left hip: Secondary | ICD-10-CM | POA: Diagnosis not present

## 2017-11-15 DIAGNOSIS — G4733 Obstructive sleep apnea (adult) (pediatric): Secondary | ICD-10-CM | POA: Diagnosis not present

## 2017-11-30 DIAGNOSIS — G4733 Obstructive sleep apnea (adult) (pediatric): Secondary | ICD-10-CM | POA: Diagnosis not present

## 2018-01-04 ENCOUNTER — Encounter: Payer: Self-pay | Admitting: Family Medicine

## 2018-01-04 DIAGNOSIS — M1612 Unilateral primary osteoarthritis, left hip: Secondary | ICD-10-CM | POA: Insufficient documentation

## 2018-02-02 ENCOUNTER — Telehealth: Payer: Self-pay | Admitting: Urology

## 2018-02-02 NOTE — Telephone Encounter (Signed)
Pt wife called office stating that Clomid is on back order and only gets a few tablets at a time, wife asking if there is something else she can prescribe him? Please advise @ [phone]

## 2018-02-03 ENCOUNTER — Ambulatory Visit
Admission: EM | Admit: 2018-02-03 | Discharge: 2018-02-03 | Disposition: A | Discharge status: Home / Self Care | Payer: BLUE CROSS/BLUE SHIELD | Attending: Family Medicine | Admitting: Family Medicine

## 2018-02-03 ENCOUNTER — Other Ambulatory Visit: Payer: Self-pay

## 2018-02-03 ENCOUNTER — Ambulatory Visit (INDEPENDENT_AMBULATORY_CARE_PROVIDER_SITE_OTHER): Payer: BLUE CROSS/BLUE SHIELD

## 2018-02-03 ENCOUNTER — Encounter: Payer: Self-pay | Admitting: Emergency Medicine

## 2018-02-03 DIAGNOSIS — S62113A Displaced fracture of triquetrum [cuneiform] bone, unspecified wrist, initial encounter for closed fracture: Secondary | ICD-10-CM | POA: Diagnosis not present

## 2018-02-03 DIAGNOSIS — S62112A Displaced fracture of triquetrum [cuneiform] bone, left wrist, initial encounter for closed fracture: Secondary | ICD-10-CM

## 2018-02-03 DIAGNOSIS — S59912A Unspecified injury of left forearm, initial encounter: Secondary | ICD-10-CM | POA: Diagnosis not present

## 2018-02-03 DIAGNOSIS — M7989 Other specified soft tissue disorders: Secondary | ICD-10-CM | POA: Diagnosis not present

## 2018-02-03 DIAGNOSIS — M79632 Pain in left forearm: Secondary | ICD-10-CM | POA: Diagnosis not present

## 2018-02-03 DIAGNOSIS — S6992XA Unspecified injury of left wrist, hand and finger(s), initial encounter: Secondary | ICD-10-CM | POA: Diagnosis not present

## 2018-02-03 DIAGNOSIS — M25532 Pain in left wrist: Secondary | ICD-10-CM | POA: Diagnosis not present

## 2018-02-03 NOTE — Telephone Encounter (Signed)
Does Frank [name] know what his insurance will cover?

## 2018-02-03 NOTE — Telephone Encounter (Signed)
Please avise

## 2018-02-03 NOTE — ED Provider Notes (Signed)
[facility]    CSN: [id number] Arrival date & time: 02/03/18  1052     History   Chief Complaint Chief Complaint  Patient presents with  . Wrist Pain    HPI Frank [name]. is a 57 y.o. male.   HPI  57 year old male who states that he was attempting to use hoverboard, lost his balance and fell off striking his left forearm and elbow on the pavement.  Since that time he has had severe pain last night.  There is swelling over the dorsum of his left wrist over the proximal carpals.  His pain along his ulna.  Does not specifically have elbow pain.  Painful to move his wrist .  Had no finger or hand problems other than the wrist.      Past Medical History:  Diagnosis Date  . Abdominal pain, right upper quadrant 09/17/2013  . Acid reflux   . Anxiety and depression   . Back pain 03/31/2015  . BPH (benign prostatic hyperplasia)   . BPH with obstruction/lower urinary tract symptoms 03/22/2015  . Frequency   . Hypogonadism in male   . Hypothyroidism   . Kidney cysts 09/17/2013  . Kidney lump 09/17/2013  . Over weight   . Overweight (BMI 25.0-29.9) 05/15/2015  . Preventative health care 03/31/2015  . Sleep apnea   . Testicular hypofunction   . Weak urinary stream     Patient Active Problem List   Diagnosis Date Noted  . Degenerative joint disease of left hip 01/04/2018  . Left groin mass 07/13/2017  . Frequent headaches 01/28/2016  . Erectile dysfunction of organic origin 09/16/2015  . Overweight (BMI 25.0-29.9) 05/15/2015  . Preventative health care 03/31/2015  . Hypothyroidism 03/31/2015  . Back pain 03/31/2015  . BPH with obstruction/lower urinary tract symptoms 03/22/2015  . Hypogonadism in male 03/22/2015  . Abdominal pain, right upper quadrant 09/17/2013  . Kidney cysts 09/17/2013  . Kidney lump 09/17/2013    Past Surgical History:  Procedure Laterality Date  . COLONOSCOPY  2014   Dr Allen Norris       Home Medications    Prior to Admission  medications   Medication Sig Start Date End Date Taking? Authorizing Provider  aspirin EC 81 MG tablet Take 81 mg by mouth daily.   Yes [provider]  Cholecalciferol (VITAMIN D3) 1000 units CAPS Take by mouth.   Yes [provider]  clomiPHENE (CLOMID) 50 MG tablet Take 0.5 tablets (25 mg total) by mouth daily. 09/14/17  Yes McGowan, Larene Beach A, PA-C  cyclobenzaprine (FLEXERIL) 10 MG tablet Take 0.5-1 tablets (5-10 mg total) by mouth 3 (three) times daily as needed for muscle spasms. 06/17/17  Yes Hubbard Hartshorn, FNP  levothyroxine (SYNTHROID, LEVOTHROID) 125 MCG tablet Take 1 tablet (125 mcg total) by mouth daily before breakfast. 08/20/16  Yes Lada, Satira Anis, MD  naproxen sodium (ANAPROX) 220 MG tablet Take 220 mg by mouth 2 (two) times daily with a meal.   Yes [provider]  sertraline (ZOLOFT) 50 MG tablet Take 50 mg by mouth daily.  02/15/14  Yes [provider]  sildenafil (REVATIO) 20 MG tablet Take 3 to 5 tablets two hours before intercouse on an empty stomach.  Do not take with nitrates. 09/14/17  Yes McGowan, Larene Beach A, PA-C  tamsulosin (FLOMAX) 0.4 MG CAPS capsule Take 1 capsule (0.4 mg total) by mouth daily. 09/14/17  Yes McGowan, Hunt Oris PA-C    Family History Family History  Problem Relation Age of Onset  . Stroke Mother   . Cancer Mother        breast  . Migraines Mother   . Breast cancer Mother   . Heart disease Unknown        uncle  . Congestive Heart Failure Maternal Grandmother   . Cancer Paternal Grandmother        throat  . Skin cancer Maternal Grandfather   . Prostate cancer Paternal Uncle   . Kidney disease Neg Hx   . Kidney cancer Neg Hx   . Bladder Cancer Neg Hx     Social History Social History   Tobacco Use  . Smoking status: Never Smoker  . Smokeless tobacco: Former Network engineer Use Topics  . Alcohol use: No    Alcohol/week: 0.0 oz  . Drug use: No     Allergies   Patient has no known  allergies.   Review of Systems Review of Systems  Constitutional: Positive for activity change. Negative for chills, fatigue and fever.  Musculoskeletal: Positive for arthralgias and joint swelling.  All other systems reviewed and are negative.    Physical Exam Triage Vital Signs ED Triage Vitals  Enc Vitals Group     BP 02/03/18 1123 113/68     Pulse Rate 02/03/18 1123 80     Resp 02/03/18 1123 16     Temp 02/03/18 1123 98.1 F (36.7 C)     Temp Source 02/03/18 1123 Oral     SpO2 02/03/18 1123 97 %     Weight 02/03/18 1122 196 lb (88.9 kg)     Height 02/03/18 1122 5\' 10"  (1.778 m)     Head Circumference --      Peak Flow --      Pain Score 02/03/18 1122 6     Pain Loc --      Pain Edu? --      Excl. in [place]? --    No data found.  Updated Vital Signs BP 113/68 (BP Location: Right Arm)   Pulse 80   Temp 98.1 F (36.7 C) (Oral)   Resp 16   Ht 5\' 10"  (1.778 m)   Wt 196 lb (88.9 kg)   SpO2 97%   BMI 28.12 kg/m   Visual Acuity Right Eye Distance:   Left Eye Distance:   Bilateral Distance:    Right Eye Near:   Left Eye Near:    Bilateral Near:     Physical Exam  Constitutional: He is oriented to person, place, and time. He appears well-developed and well-nourished. No distress.  HENT:  Head: Normocephalic.  Eyes: Pupils are equal, round, and reactive to light. Right eye exhibits no discharge. Left eye exhibits no discharge.  Neck: Normal range of motion.  Musculoskeletal: He exhibits edema and tenderness.  Emanation of the left nondominant extremity shows good range of motion of the shoulder and elbow to flexion and extension.  There is tenderness not along the elbow or olecranon but along the ulna particually proximally.  Examination of the wrist shows mild swelling over the dorsum over the proximal  carpal bones.  There is mild snuffbox tenderness .  He has no tenderness of the radius distally.  Extension of the wrist is 30 degrees flexion of 20 degrees.   Pronation and supination lacks approximately 20 degrees.  Neurological: He is alert and oriented to person, place, and time.  Skin: Skin is warm and dry. He is not diaphoretic.  Psychiatric: He has  a normal mood and affect. His behavior is normal. Judgment and thought content normal.  Nursing note and vitals reviewed.    UC Treatments / Results  Labs (all labs ordered are listed, but only abnormal results are displayed) Labs Reviewed - No data to display  EKG None  Radiology Dg Forearm Left  Result Date: 02/03/2018 CLINICAL DATA:  [place] off hover board last night with left wrist injury. Carpal and ulnar wrist pain radiating to the elbow. Initial encounter. EXAM: LEFT FOREARM - 2 VIEW COMPARISON:  None. FINDINGS: No fracture of the radius or ulna is evident. The wrist and elbow are located. On the lateral radiograph, there is the suggestion of a 5 mm osseous fragment along the dorsal aspect of the proximal carpal row with overlying soft tissue swelling. IMPRESSION: 1. Small osseous fragment in the dorsal wrist with overlying soft tissue swelling, possibly a triquetral fracture. 2. No radius or ulna fracture. Electronically Signed   By: Logan Bores M.D.   On: 02/03/2018 11:58   Dg Wrist Complete Left  Result Date: 02/03/2018 CLINICAL DATA:  [place] off hover board last night with left wrist injury. Carpal and ulnar wrist pain radiating to the elbow. Initial encounter. EXAM: LEFT WRIST - COMPLETE 3+ VIEW COMPARISON:  None. FINDINGS: Soft tissue swelling is noted along the dorsal and ulnar aspects of the wrist. The small osseous fragment along the dorsal aspect of the proximal carpal row on the concurrent form radiographs is not well demonstrated on this study. Mild deformity of the scaphoid waist may reflect an old, healed fracture. Joint space widths are preserved. IMPRESSION: Dorsal wrist soft tissue swelling without definite acute fracture identified on this study. Please see separate forearm  radiograph report. Electronically Signed   By: Logan Bores M.D.   On: 02/03/2018 12:03    Procedures Procedures (including critical care time)  Medications Ordered in UC Medications - No data to display  Initial Impression / Assessment and Plan / UC Course  I have reviewed the triage vital signs and the nursing notes.  Pertinent labs & imaging results that were available during my care of the patient were reviewed by me and considered in my medical decision making (see chart for details).     Plan: 1. Test/x-ray results and diagnosis reviewed with patient 2. rx as per orders; risks, benefits, potential side effects reviewed with patient 3. Recommend supportive treatment with elevation to control swelling and pain.  Patient placed in a sugar tong splint and given a sling.  He will make an appointment with  [facility] where he is going on Wednesday for hip injection.  Has  never injured his wrist in the past so the findings of the scaphoid may be of significance.  he was given x-ray disc.  Recommend Naprosyn and Tylenol for pain. 4. F/u prn if symptoms worsen or don't improve  Final Clinical Impressions(s) / UC Diagnoses   Final diagnoses:  Triquetral chip fracture, left, closed, initial encounter     Discharge Instructions     Apply ice 20 minutes out of every 2 hours 4-5 times daily for comfort. Elevate your wrist above your heart most of the time for pain control and swelling.Schedule an Appointment with [facility]  for the first of next week.    ED Prescriptions    None     Controlled Substance Prescriptions [place] Controlled Substance Registry consulted? Not Applicable   Lorin Picket, PA-C 02/03/18 1251

## 2018-02-03 NOTE — Discharge Instructions (Signed)
Apply ice 20 minutes out of every 2 hours 4-5 times daily for comfort. Elevate your wrist above your heart most of the time for pain control and swelling.Schedule an Appointment with [facility]  for the first of next week.

## 2018-02-03 NOTE — ED Triage Notes (Signed)
Patient fell last night landing on his left arm. Patient c/o left elbow pain through his left hand.

## 2018-02-06 DIAGNOSIS — S52509A Unspecified fracture of the lower end of unspecified radius, initial encounter for closed fracture: Secondary | ICD-10-CM | POA: Insufficient documentation

## 2018-02-06 DIAGNOSIS — S52522A Torus fracture of lower end of left radius, initial encounter for closed fracture: Secondary | ICD-10-CM | POA: Diagnosis not present

## 2018-02-06 IMAGING — MR MR MRA HEAD W/O CM
1 series · 23 of 48 positions shown · non-contrast
Comparison: MRI brain 02/11/2016.

CLINICAL DATA: Prior head injury in 4555, with pounding in LEFT ear
for 2 months. Pulsing behind LEFT eye for 3 weeks.

EXAM:
MRA HEAD WITHOUT CONTRAST
TECHNIQUE: Angiographic images of the Circle of Willis were obtained using MRA
technique without intravenous contrast.

[Series 3: TOF · axial · non-contrast · 0.7mm · 0.37mm/px · z∈[-54,+45]mm · 23 of 150 slices shown]
[im 1/150]
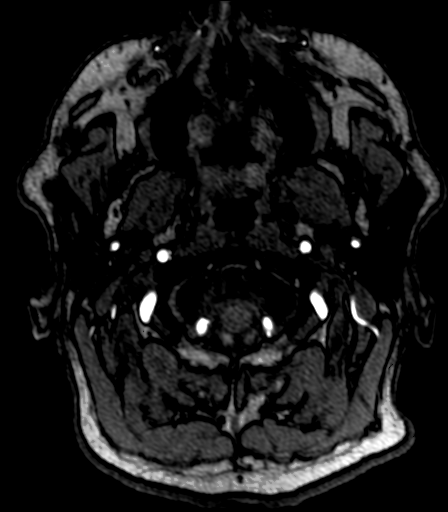
[im 4/150]
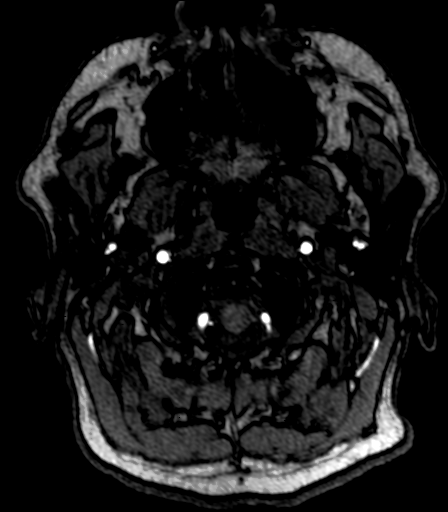
[im 7/150]
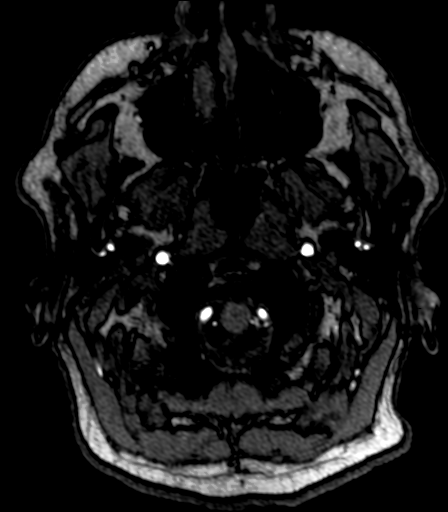
[im 10/150]
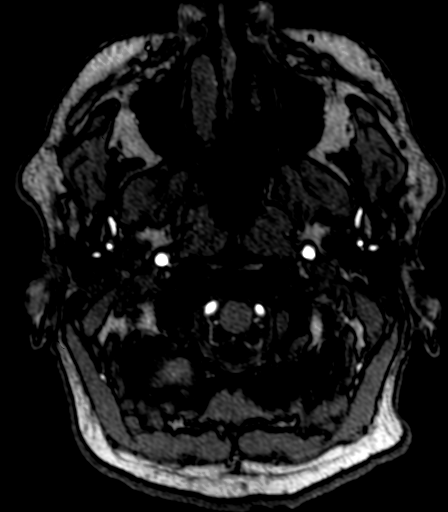
[im 13/150]
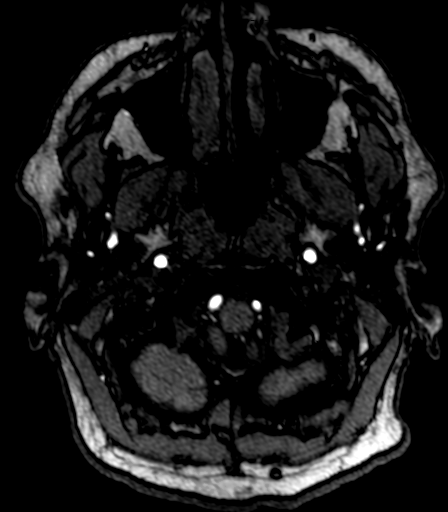
[im 16/150]
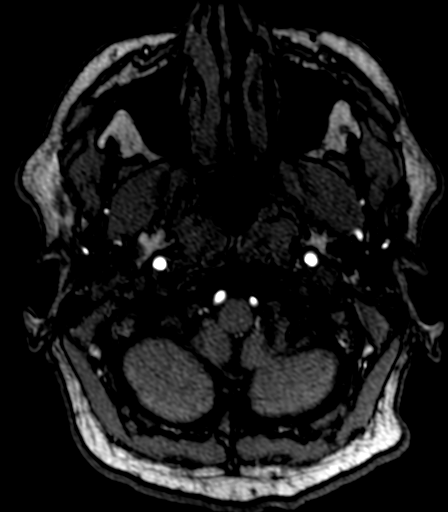
[im 20/150]
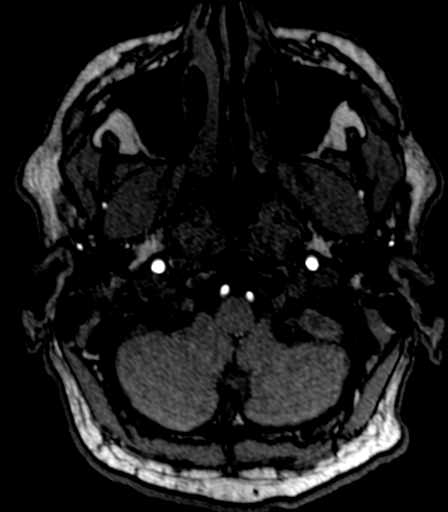
[im 23/150]
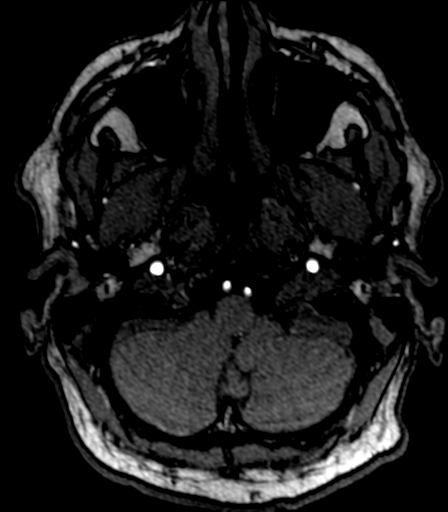
[im 26/150]
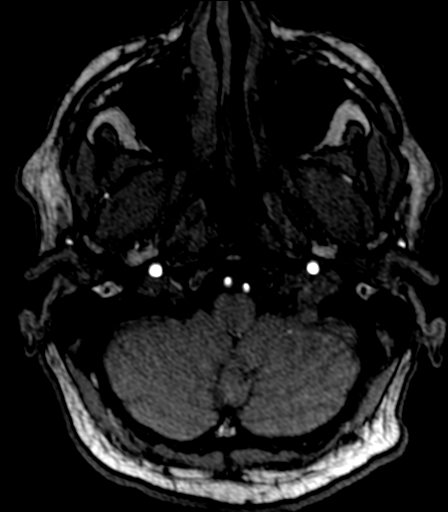
[im 29/150]
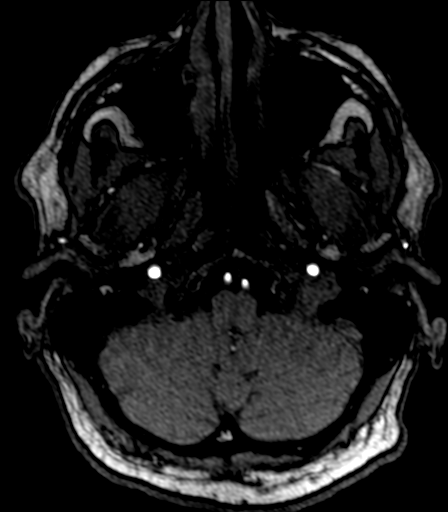
[im 32/150]
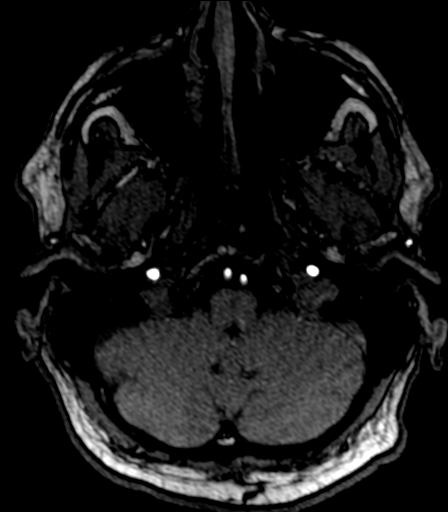
[im 35/150]
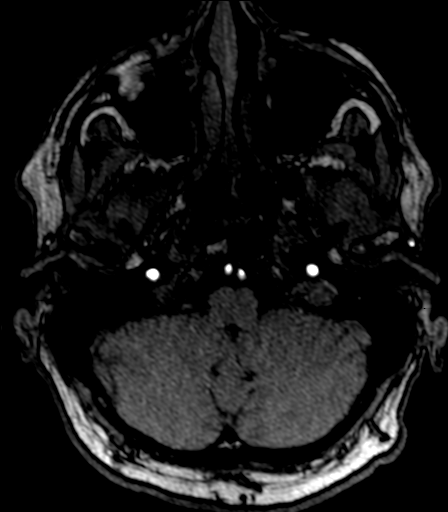
[im 39/150]
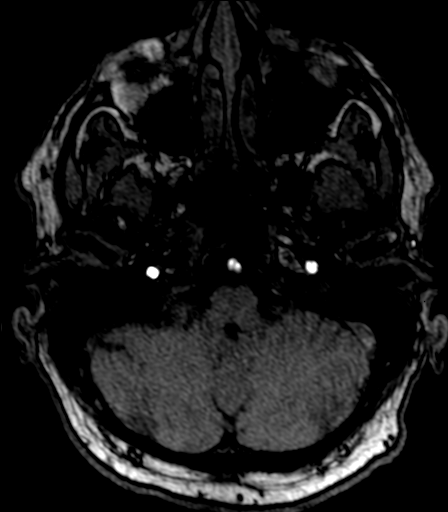
[im 42/150]
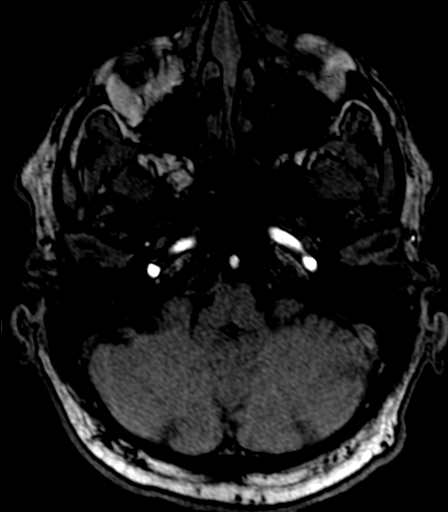
[im 45/150]
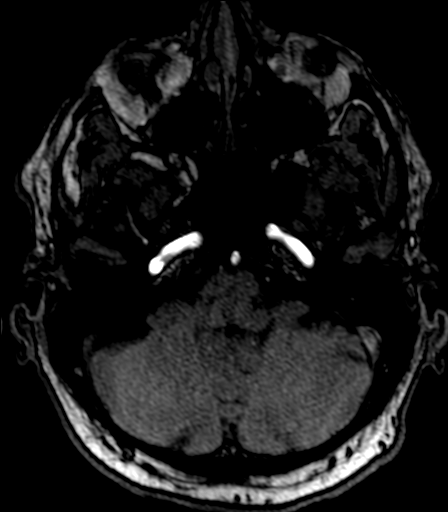
[im 48/150]
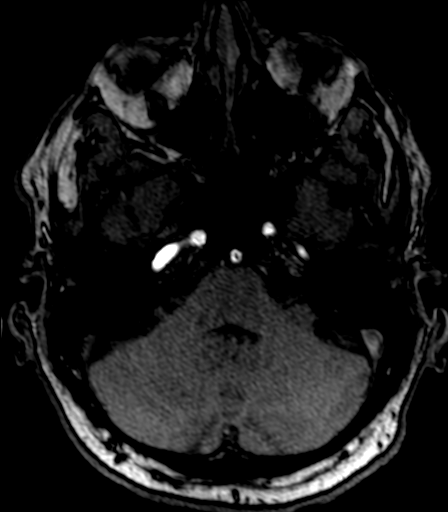
[im 67/150]
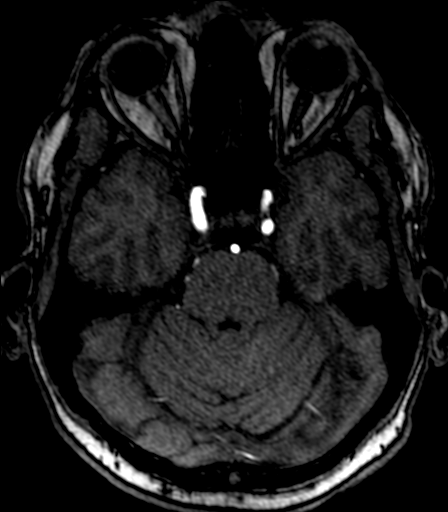
[im 77/150]
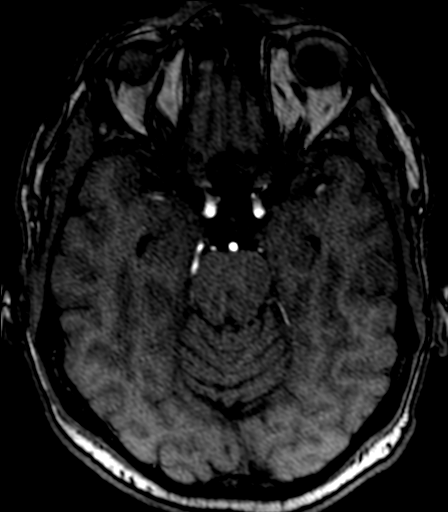
[im 86/150]
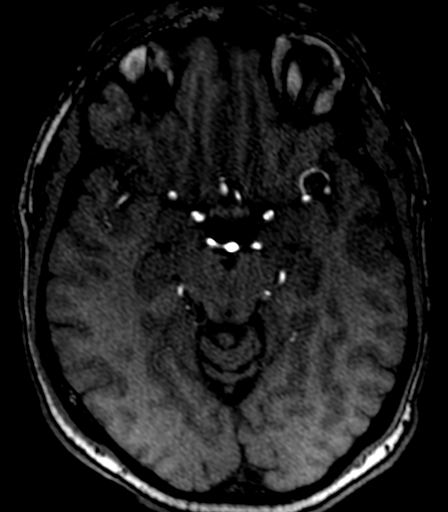
[im 105/150]
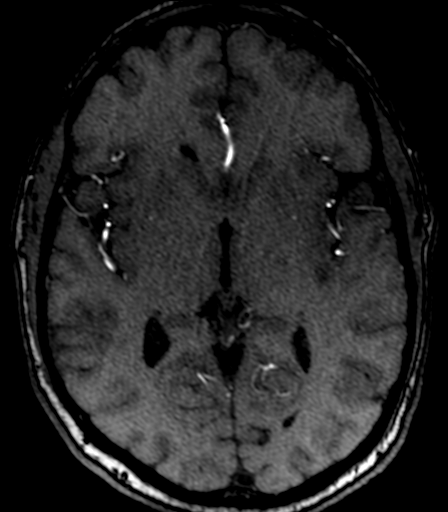
[im 124/150]
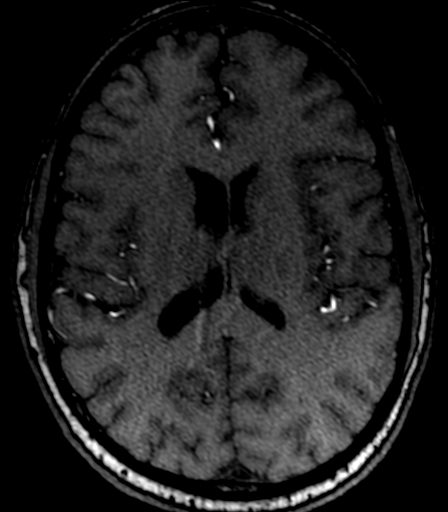
[im 127/150]
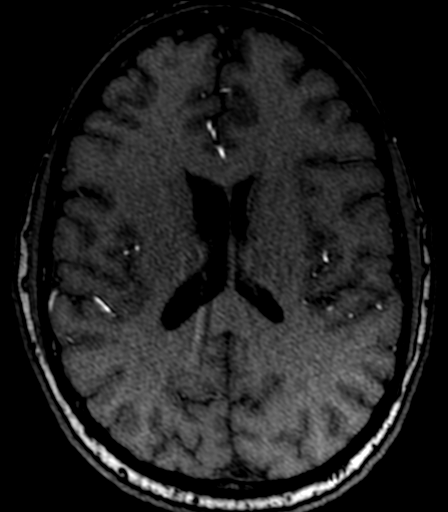
[im 143/150]
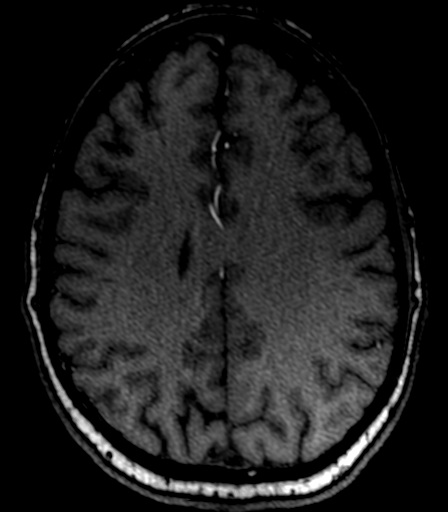

[23 of 48 positions shown; findings below may reference images not displayed]

FINDINGS: The internal carotid arteries are widely patent. There is BILATERAL
symmetric loss of signal in the internal carotid arteries at the
cavernous supraclinoid junction due to tortuosity. ICA termini
widely patent. No proximal stenosis of the anterior or middle
cerebral arteries.

Basilar artery widely patent with vertebrals codominant. Both
posterior cerebral arteries are widely patent. There is no
cerebellar branch occlusion.

No intracranial saccular aneurysm is identified.

Within limits for detection on MRA intracranial, no pial AVM or
dural AV fistula. No features suggestive of carotid cavernous
fistula.
IMPRESSION: Negative exam. Good general agreement with prior MR brain. No cause
is seen for the reported symptoms.

## 2018-02-08 DIAGNOSIS — M1612 Unilateral primary osteoarthritis, left hip: Secondary | ICD-10-CM | POA: Diagnosis not present

## 2018-02-08 NOTE — Telephone Encounter (Signed)
Pt informed to call his insurance and find out what will be covered.

## 2018-02-21 DIAGNOSIS — S52502D Unspecified fracture of the lower end of left radius, subsequent encounter for closed fracture with routine healing: Secondary | ICD-10-CM | POA: Diagnosis not present

## 2018-03-09 NOTE — Telephone Encounter (Signed)
Was he able to get back on his Clomid?

## 2018-03-21 DIAGNOSIS — S52502D Unspecified fracture of the lower end of left radius, subsequent encounter for closed fracture with routine healing: Secondary | ICD-10-CM | POA: Diagnosis not present

## 2018-04-12 ENCOUNTER — Encounter: Payer: Self-pay | Admitting: Family Medicine

## 2018-04-12 ENCOUNTER — Ambulatory Visit (INDEPENDENT_AMBULATORY_CARE_PROVIDER_SITE_OTHER): Payer: BLUE CROSS/BLUE SHIELD | Admitting: Family Medicine

## 2018-04-12 DIAGNOSIS — Z0000 Encounter for general adult medical examination without abnormal findings: Secondary | ICD-10-CM | POA: Diagnosis not present

## 2018-04-12 LAB — COMPLETE METABOLIC PANEL WITH GFR
AG Ratio: 1.7 (calc) (ref 1.0–2.5)
ALT: 15 U/L (ref 9–46)
AST: 16 U/L (ref 10–35)
Albumin: 4.1 g/dL (ref 3.6–5.1)
Alkaline phosphatase (APISO): 58 U/L (ref 40–115)
BILIRUBIN TOTAL: 0.6 mg/dL (ref 0.2–1.2)
BUN: 17 mg/dL (ref 7–25)
CALCIUM: 9.5 mg/dL (ref 8.6–10.3)
CHLORIDE: 106 mmol/L (ref 98–110)
CO2: 27 mmol/L (ref 20–32)
Creat: 1.31 mg/dL (ref 0.70–1.33)
GFR, EST AFRICAN AMERICAN: 71 mL/min/{1.73_m2} (ref >=60)
GFR, EST NON AFRICAN AMERICAN: 61 mL/min/{1.73_m2} (ref >=60)
GLOBULIN: 2.4 g/dL (ref 1.9–3.7)
Glucose, Bld: 110 mg/dL (ref 65–139)
POTASSIUM: 4.7 mmol/L (ref 3.5–5.3)
Sodium: 140 mmol/L (ref 135–146)
TOTAL PROTEIN: 6.5 g/dL (ref 6.1–8.1)

## 2018-04-12 LAB — CBC WITH DIFFERENTIAL/PLATELET
BASOS PCT: 0.4 %
Basophils Absolute: 29 cells/uL (ref 0–200)
EOS ABS: 168 {cells}/uL (ref 15–500)
Eosinophils Relative: 2.3 %
HEMATOCRIT: 44.2 % (ref 38.5–50.0)
Hemoglobin: 14.8 g/dL (ref 13.2–17.1)
LYMPHS ABS: 2482 {cells}/uL (ref 850–3900)
MCH: 29.8 pg (ref 27.0–33.0)
MCHC: 33.5 g/dL (ref 32.0–36.0)
MCV: 88.9 fL (ref 80.0–100.0)
MPV: 9.3 fL (ref 7.5–12.5)
Monocytes Relative: 8.4 %
NEUTROS PCT: 54.9 %
Neutro Abs: 4008 cells/uL (ref 1500–7800)
PLATELETS: 243 10*3/uL (ref 140–400)
RBC: 4.97 10*6/uL (ref 4.20–5.80)
RDW: 12.6 % (ref 11.0–15.0)
TOTAL LYMPHOCYTE: 34 %
WBC: 7.3 10*3/uL (ref 3.8–10.8)
WBCMIX: 613 {cells}/uL (ref 200–950)

## 2018-04-12 LAB — LIPID PANEL
CHOL/HDL RATIO: 4.3 (calc) (ref <5.0)
Cholesterol: 213 mg/dL — ABNORMAL HIGH (ref <200)
HDL: 50 mg/dL (ref >40)
LDL Cholesterol (Calc): 143 mg/dL (calc) — ABNORMAL HIGH
NON-HDL CHOLESTEROL (CALC): 163 mg/dL — AB (ref <130)
Triglycerides: 96 mg/dL (ref <150)

## 2018-04-12 LAB — TSH: TSH: 1.12 mIU/L (ref 0.40–4.50)

## 2018-04-12 NOTE — Assessment & Plan Note (Signed)
USPSTF grade A and B recommendations reviewed with patient; age-appropriate recommendations, preventive care, screening tests, etc discussed and encouraged; healthy living encouraged; see AVS for patient education given to patient  
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
## 2018-04-12 NOTE — Patient Instructions (Addendum)
Check out the information at familydoctor.org entitled "Nutrition for Weight Loss: What You Need to Know about Fad Diets" Try to lose between 1-2 pounds per week by taking in fewer calories and burning off more calories You can succeed by limiting portions, limiting foods dense in calories and fat, becoming more active, and drinking 8 glasses of water a day (64 ounces) Don't skip meals, especially breakfast, as skipping meals may alter your metabolism Do not use over-the-counter weight loss pills or gimmicks that claim rapid weight loss A healthy BMI (or body mass index) is between 18.5 and 24.9 You can calculate your ideal BMI at the [place] website ClubMonet[employer].fr   Health Maintenance, Male A healthy lifestyle and preventive care is important for your health and wellness. Ask your health care provider about what schedule of regular examinations is right for you. What should I know about weight and diet? Eat a Healthy Diet  Eat plenty of vegetables, fruits, whole grains, low-fat dairy products, and lean protein.  Do not eat a lot of foods high in solid fats, added sugars, or salt.  Maintain a Healthy Weight Regular exercise can help you achieve or maintain a healthy weight. You should:  Do at least 150 minutes of exercise each week. The exercise should increase your heart rate and make you sweat (moderate-intensity exercise).  Do strength-training exercises at least twice a week.  Watch Your Levels of Cholesterol and Blood Lipids  Have your blood tested for lipids and cholesterol every 5 years starting at 55 years of age. If you are at high risk for heart disease, you should start having your blood tested when you are 55 years old. You may need to have your cholesterol levels checked more often if: ? Your lipid or cholesterol levels are high. ? You are older than 55 years of age. ? You are at high risk for heart disease.  What should I  know about cancer screening? Many types of cancers can be detected early and may often be prevented. Lung Cancer  You should be screened every year for lung cancer if: ? You are a current smoker who has smoked for at least 30 years. ? You are a former smoker who has quit within the past 15 years.  Talk to your health care provider about your screening options, when you should start screening, and how often you should be screened.  Colorectal Cancer  Routine colorectal cancer screening usually begins at 55 years of age and should be repeated every 5-10 years until you are 55 years old. You may need to be screened more often if early forms of precancerous polyps or small growths are found. Your health care provider may recommend screening at an earlier age if you have risk factors for colon cancer.  Your health care provider may recommend using home test kits to check for hidden blood in the stool.  A small camera at the end of a tube can be used to examine your colon (sigmoidoscopy or colonoscopy). This checks for the earliest forms of colorectal cancer.  Prostate and Testicular Cancer  Depending on your age and overall health, your health care provider may do certain tests to screen for prostate and testicular cancer.  Talk to your health care provider about any symptoms or concerns you have about testicular or prostate cancer.  Skin Cancer  Check your skin from head to toe regularly.  Tell your health care provider about any new moles or changes in moles, especially if: ?  There is a change in a mole's size, shape, or color. ? You have a mole that is larger than a pencil eraser.  Always use sunscreen. Apply sunscreen liberally and repeat throughout the day.  Protect yourself by wearing long sleeves, pants, a wide-brimmed hat, and sunglasses when outside.  What should I know about heart disease, diabetes, and high blood pressure?  If you are 74-39 years of age, have your blood  pressure checked every 3-5 years. If you are 77 years of age or older, have your blood pressure checked every year. You should have your blood pressure measured twice-once when you are at a hospital or clinic, and once when you are not at a hospital or clinic. Record the average of the two measurements. To check your blood pressure when you are not at a hospital or clinic, you can use: ? An automated blood pressure machine at a pharmacy. ? A home blood pressure monitor.  Talk to your health care provider about your target blood pressure.  If you are between 58-34 years old, ask your health care provider if you should take aspirin to prevent heart disease.  Have regular diabetes screenings by checking your fasting blood sugar level. ? If you are at a normal weight and have a low risk for diabetes, have this test once every three years after the age of 63. ? If you are overweight and have a high risk for diabetes, consider being tested at a younger age or more often.  A one-time screening for abdominal aortic aneurysm (AAA) by ultrasound is recommended for men aged 73-75 years who are current or former smokers. What should I know about preventing infection? Hepatitis B If you have a higher risk for hepatitis B, you should be screened for this virus. Talk with your health care provider to find out if you are at risk for hepatitis B infection. Hepatitis C Blood testing is recommended for:  Everyone born from [birth year] through 1963.  Anyone with known risk factors for hepatitis C.  Sexually Transmitted Diseases (STDs)  You should be screened each year for STDs including gonorrhea and chlamydia if: ? You are sexually active and are younger than 55 years of age. ? You are older than 55 years of age and your health care provider tells you that you are at risk for this type of infection. ? Your sexual activity has changed since you were last screened and you are at an increased risk for chlamydia or  gonorrhea. Ask your health care provider if you are at risk.  Talk with your health care provider about whether you are at high risk of being infected with HIV. Your health care provider may recommend a prescription medicine to help prevent HIV infection.  What else can I do?  Schedule regular health, dental, and eye exams.  Stay current with your vaccines (immunizations).  Do not use any tobacco products, such as cigarettes, chewing tobacco, and e-cigarettes. If you need help quitting, ask your health care provider.  Limit alcohol intake to no more than 2 drinks per day. One drink equals 12 ounces of beer, 5 ounces of wine, or 1 ounces of hard liquor.  Do not use street drugs.  Do not share needles.  Ask your health care provider for help if you need support or information about quitting drugs.  Tell your health care provider if you often feel depressed.  Tell your health care provider if you have ever been abused or do not feel  safe at home. This information is not intended to replace advice given to you by your health care provider. Make sure you discuss any questions you have with your health care provider. Document Released: 01/01/2008 Document Revised: 03/03/2016 Document Reviewed: 04/08/2015 Elsevier Interactive Patient Education  2018 [place] about whether or not you should be tested for dioxin

## 2018-04-14 ENCOUNTER — Other Ambulatory Visit: Payer: Self-pay | Admitting: Family Medicine

## 2018-04-14 ENCOUNTER — Encounter: Payer: Self-pay | Admitting: Family Medicine

## 2018-04-14 DIAGNOSIS — N182 Chronic kidney disease, stage 2 (mild): Secondary | ICD-10-CM | POA: Insufficient documentation

## 2018-04-14 MED ORDER — LEVOTHYROXINE SODIUM 125 MCG PO TABS: 125.0000 ug | ORAL_TABLET | Freq: Every day | ORAL | 1 refills | Status: AC

## 2018-04-14 NOTE — Progress Notes (Signed)
Refilled thyroid med.

## 2018-04-18 DIAGNOSIS — Z1389 Encounter for screening for other disorder: Secondary | ICD-10-CM | POA: Diagnosis not present

## 2018-04-18 DIAGNOSIS — E039 Hypothyroidism, unspecified: Secondary | ICD-10-CM | POA: Diagnosis not present

## 2018-04-22 ENCOUNTER — Other Ambulatory Visit: Payer: Self-pay | Admitting: Urology

## 2018-04-22 DIAGNOSIS — E291 Testicular hypofunction: Secondary | ICD-10-CM

## 2018-06-08 DIAGNOSIS — S52502D Unspecified fracture of the lower end of left radius, subsequent encounter for closed fracture with routine healing: Secondary | ICD-10-CM | POA: Diagnosis not present

## 2018-07-03 DIAGNOSIS — D224 Melanocytic nevi of scalp and neck: Secondary | ICD-10-CM | POA: Diagnosis not present

## 2018-07-03 DIAGNOSIS — Z1283 Encounter for screening for malignant neoplasm of skin: Secondary | ICD-10-CM | POA: Diagnosis not present

## 2018-07-03 DIAGNOSIS — D225 Melanocytic nevi of trunk: Secondary | ICD-10-CM | POA: Diagnosis not present

## 2018-07-03 DIAGNOSIS — D2230 Melanocytic nevi of unspecified part of face: Secondary | ICD-10-CM | POA: Diagnosis not present

## 2018-08-02 ENCOUNTER — Telehealth: Payer: Self-pay

## 2018-08-02 NOTE — Telephone Encounter (Signed)
Left message for patient to call office to let us know what pharmacy we can send clomid to since [facility] is on backorder.

## 2018-08-20 ENCOUNTER — Other Ambulatory Visit: Payer: Self-pay | Admitting: Urology

## 2018-08-21 NOTE — Telephone Encounter (Signed)
Tamsulosin refill sent to pharmacy.

## 2018-09-08 ENCOUNTER — Other Ambulatory Visit: Payer: Self-pay

## 2018-09-08 DIAGNOSIS — N529 Male erectile dysfunction, unspecified: Secondary | ICD-10-CM

## 2018-09-08 DIAGNOSIS — E291 Testicular hypofunction: Secondary | ICD-10-CM

## 2018-09-08 DIAGNOSIS — E349 Endocrine disorder, unspecified: Secondary | ICD-10-CM

## 2018-09-11 ENCOUNTER — Other Ambulatory Visit: Payer: BLUE CROSS/BLUE SHIELD

## 2018-09-11 DIAGNOSIS — E349 Endocrine disorder, unspecified: Secondary | ICD-10-CM

## 2018-09-11 DIAGNOSIS — E291 Testicular hypofunction: Secondary | ICD-10-CM

## 2018-09-11 DIAGNOSIS — N529 Male erectile dysfunction, unspecified: Secondary | ICD-10-CM

## 2018-09-12 LAB — HEPATIC FUNCTION PANEL
ALK PHOS: 62 IU/L (ref 39–117)
ALT: 11 IU/L (ref 0–44)
AST: 13 IU/L (ref 0–40)
Albumin: 3.9 g/dL (ref 3.8–4.9)
Bilirubin Total: 0.6 mg/dL (ref 0.0–1.2)
Bilirubin, Direct: 0.12 mg/dL (ref 0.00–0.40)
Total Protein: 6 g/dL (ref 6.0–8.5)

## 2018-09-12 LAB — HEMOGLOBIN AND HEMATOCRIT, BLOOD
Hematocrit: 43.4 % (ref 37.5–51.0)
Hemoglobin: 14.5 g/dL (ref 13.0–17.7)

## 2018-09-12 LAB — TESTOSTERONE: Testosterone: 433 ng/dL (ref 264–916)

## 2018-09-12 LAB — PSA: Prostate Specific Ag, Serum: 2.4 ng/mL (ref 0.0–4.0)

## 2018-09-12 NOTE — Progress Notes (Signed)
09/13/2018 9:03 AM   Frank [name]. 1960/07/20 [id number]  Referring provider: Arnetha Courser, MD [address] [place], [place] [address]  Chief Complaint  Patient presents with  . Benign Prostatic Hypertrophy  . Low Testosterone    HPI: Frank [name]. is a 58 y.o. male Caucasian with testosterone deficiency, erectile dysfunction and BPH with LUTS who presents today for a one year follow-up  Testosterone deficiency He is no longer having spontaneous erections at night.   He has sleep apnea and is sleeping with a CPAP machine.  His current testosterone level is 433 ng/mL on 09/11/2018.  He is currently managing his hypogonadism with Clomid 50 mg, 1/2 tablet daily.  His HCT and LFT's are normal.    BPH WITH LUTS  (prostate and/or bladder) IPSS score: 13/2  Previous score: 7/1  Major complaint(s):  Weak stream x several years. Denies any dysuria, hematuria or suprapubic pain.   Currently taking: Tamsulosin 0.4 mg daily.  His has been without tamsulosin for 2 weeks and has noted a worsening of his symptoms.  Otherwise, he denies any urinary symptoms.   Denies any recent fevers, chills, nausea or vomiting.  He does have a family history of PCa, with a paternal uncle having been diagnosed in his 80s; he passed away at 45 and patient is not sure what he died of.  He will talk to his paternal uncle's widow.  IPSS    Row Name 09/13/18 1500         International Prostate Symptom Score   How often have you had the sensation of not emptying your bladder?  About half the time     How often have you had to urinate less than every two hours?  Less than half the time     How often have you found you stopped and started again several times when you urinated?  Less than half the time     How often have you found it difficult to postpone urination?  Less than 1 in 5 times     How often have you had a weak urinary stream?  About half the time     How often have you had  to strain to start urination?  Less than half the time     How many times did you typically get up at night to urinate?  None     Total IPSS Score  13       Quality of Life due to urinary symptoms   If you were to spend the rest of your life with your urinary condition just the way it is now how would you feel about that?  Mostly Satisfied       Score:  1-7 Mild 8-19 Moderate 20-35 Severe  Erectile dysfunction His SHIM score is 20, which is mild ED.   His previous SHIM score was 22.  He has been having difficulty with erections for several years.  His major complaint is achieving an erection.  His libido is diminished.   His risk factors for ED are age, BPH, testosterone deficiency, hypothyroidism, sleep apnea, and depression.  He denies any painful erections or curvatures with his erections.  He is no longer having spontaneous erections.  He has tried sildenafil in the past; his current SHIM score is a mix of with and without sildenafil.  SHIM    Row Name 09/13/18 1555         SHIM: Over the last  6 months:   How do you rate your confidence that you could get and keep an erection?  Moderate     When you had erections with sexual stimulation, how often were your erections hard enough for penetration (entering your partner)?  Most Times (much more than half the time)     During sexual intercourse, how often were you able to maintain your erection after you had penetrated (entered) your partner?  Almost Always or Always     During sexual intercourse, how difficult was it to maintain your erection to completion of intercourse?  Slightly Difficult     When you attempted sexual intercourse, how often was it satisfactory for you?  Most Times (much more than half the time)       SHIM Total Score   SHIM  20       Score: 1-7 Severe ED 8-11 Moderate ED 12-16 Mild-Moderate ED 17-21 Mild ED 22-25 No ED  Patient had a fall in December 2018 where he landed flat on his back.  He had a CT scan  in the ED which noted no cause of the clinical presentation is identified. The patient does have a few tiny calcifications in the kidneys, none larger than 1 mm, but there is no evidence of passing stone or hydronephrosis.  No stone in the bladder.  No traumatic finding visible related to the described fall 1 week prior to his 09/14/2017 visit.  On his 09/14/2017 visit, he had an enlarged lymph node in his left groin and has been evaluated by general surgery.  He was still continuing to have the left hip pain, and was very anxious as his mother had just passed away from breast cancer and he was fearful this was a cancer.  He reported today (08/2018) that it was simply an 'angry' lymph node.  PMH: Past Medical History:  Diagnosis Date  . Abdominal pain, right upper quadrant 09/17/2013  . Acid reflux   . Anxiety and depression   . Back pain 03/31/2015  . BPH (benign prostatic hyperplasia)   . BPH with obstruction/lower urinary tract symptoms 03/22/2015  . Frequency   . Hypogonadism in male   . Hypothyroidism   . Kidney cysts 09/17/2013  . Kidney lump 09/17/2013  . Over weight   . Overweight (BMI 25.0-29.9) 05/15/2015  . Preventative health care 03/31/2015  . Sleep apnea   . Testicular hypofunction   . Weak urinary stream     Surgical History: Past Surgical History:  Procedure Laterality Date  . COLONOSCOPY  2014   Dr Allen Norris    Home Medications:  Allergies as of 09/13/2018   No Known Allergies     Medication List       Accurate as of September 13, 2018 11:59 PM. Always use your most recent med list.        aspirin EC 81 MG tablet Take 81 mg by mouth daily.   clomiPHENE 50 MG tablet Commonly known as:  CLOMID TAKE 1/2 (ONE-HALF) TABLET BY MOUTH ONCE DAILY   clomiPHENE 50 MG tablet Commonly known as:  CLOMID Take 0.5 tablets (25 mg total) by mouth daily.   cyclobenzaprine 10 MG tablet Commonly known as:  FLEXERIL Take 0.5-1 tablets (5-10 mg total) by mouth 3 (three) times daily  as needed for muscle spasms.   levothyroxine 125 MCG tablet Commonly known as:  SYNTHROID, LEVOTHROID Take 1 tablet (125 mcg total) by mouth daily before breakfast.   naproxen sodium 220 MG tablet Commonly  known as:  ALEVE Take 220 mg by mouth 2 (two) times daily with a meal.   sertraline 50 MG tablet Commonly known as:  ZOLOFT Take 50 mg by mouth daily.   sildenafil 20 MG tablet Commonly known as:  REVATIO Take 3 to 5 tablets two hours before intercouse on an empty stomach.  Do not take with nitrates.   tamsulosin 0.4 MG Caps capsule Commonly known as:  FLOMAX Take 1 capsule (0.4 mg total) by mouth daily.   Vitamin D3 25 MCG (1000 UT) Caps Take by mouth.       Allergies: No Known Allergies  Family History: Family History  Problem Relation Age of Onset  . Stroke Mother   . Cancer Mother        breast  . Migraines Mother   . Breast cancer Mother   . Heart disease Unknown        uncle  . Congestive Heart Failure Maternal Grandmother   . Cancer Paternal Grandmother        throat  . Skin cancer Maternal Grandfather   . Prostate cancer Paternal Uncle   . Kidney disease Neg Hx   . Kidney cancer Neg Hx   . Bladder Cancer Neg Hx     Social History:  reports that he has never smoked. He quit smokeless tobacco use about 33 years ago. He reports that he does not drink alcohol or use drugs.  ROS: UROLOGY Frequent Urination?: Yes Hard to postpone urination?: No Burning/pain with urination?: No Get up at night to urinate?: No Leakage of urine?: No Urine stream starts and stops?: No Trouble starting stream?: No Do you have to strain to urinate?: No Blood in urine?: No Urinary tract infection?: No Sexually transmitted disease?: No Injury to kidneys or bladder?: No Painful intercourse?: No Weak stream?: Yes Erection problems?: No Penile pain?: No  Gastrointestinal Nausea?: No Vomiting?: No Indigestion/heartburn?: No Diarrhea?: No Constipation?:  No  Constitutional Fever: No Night sweats?: No Weight loss?: No Fatigue?: No  Skin Skin rash/lesions?: No Itching?: No  Eyes Blurred vision?: No Double vision?: No  Ears/Nose/Throat Sore throat?: No Sinus problems?: No  Hematologic/Lymphatic Swollen glands?: No Easy bruising?: No  Cardiovascular Leg swelling?: No Chest pain?: No  Respiratory Cough?: No Shortness of breath?: No  Endocrine Excessive thirst?: No  Musculoskeletal Back pain?: Yes Joint pain?: No  Neurological Headaches?: Yes Dizziness?: No  Psychologic Depression?: No Anxiety?: No  Physical Exam: BP 113/76 (BP Location: Left Arm, Patient Position: Sitting)   Pulse 86   Ht 5\' 10"  (1.778 m)   Wt 202 lb (91.6 kg)   BMI 28.98 kg/m   Constitutional:  Well nourished. Alert and oriented, No acute distress. Cardiovascular: No clubbing, cyanosis, or edema. Respiratory: Normal respiratory effort, no increased work of breathing. GU: No CVA tenderness.  No bladder fullness or masses.  Patient with circumcised phallus.  Urethral meatus is patent.  No penile discharge. No penile lesions or rashes. Scrotum without lesions, cysts, rashes and/or edema.  Testicles are located scrotally bilaterally. No masses are appreciated in the testicles. Left and right epididymis are normal. Rectal: Patient with normal sphincter tone.  Anus and perineum without scarring or rashes. No rectal masses are appreciated. Prostate is approximately 45 grams, no nodules are appreciated.  Seminal vesicles are normal. Skin: No rashes, bruises or suspicious lesions. Neurologic: Grossly intact, no focal deficits, moving all 4 extremities. Psychiatric: Normal mood and affect.  Laboratory Data: Lab Results  Component Value Date  WBC 7.3 04/12/2018   HGB 14.5 09/11/2018   HCT 43.4 09/11/2018   MCV 88.9 04/12/2018   PLT 243 04/12/2018    Lab Results  Component Value Date   CREATININE 1.31 04/12/2018    Previous PSA's:         0.2 ng/mL on 05/30/2014       0.3 ng/mL on 09/12/2015  0.3 ng/mL on 08/11/2016  0.4 ng/mL on 02/14/2017  0.4 ng/mL on 09/09/2017  2.4 ng/mL on 09/11/2018  Lab Results  Component Value Date   TESTOSTERONE 433 09/11/2018   No results found for: HGBA1C  Lab Results  Component Value Date   TSH 1.12 04/12/2018       Component Value Date/Time   CHOL 213 (H) 04/12/2018 0909   CHOL 205 (H) 03/31/2015 1535   HDL 50 04/12/2018 0909   HDL 44 03/31/2015 1535   CHOLHDL 4.3 04/12/2018 0909   VLDL 23 05/17/2016 1604   LDLCALC 143 (H) 04/12/2018 0909    Lab Results  Component Value Date   AST 13 09/11/2018   Lab Results  Component Value Date   ALT 11 09/11/2018   No components found for: ALKALINEPHOPHATASE No components found for: BILIRUBINTOTAL  No results found for: ESTRADIOL  Urinalysis    Component Value Date/Time   COLORURINE COLORLESS (A) 06/13/2017 1430   APPEARANCEUR CLEAR (A) 06/13/2017 1430   APPEARANCEUR CLEAR 05/27/2014 1236   LABSPEC 1.003 (L) 06/13/2017 1430   LABSPEC 1.010 05/27/2014 1236   PHURINE 6.0 06/13/2017 1430   GLUCOSEU NEGATIVE 06/13/2017 1430   GLUCOSEU NEGATIVE 05/27/2014 1236   HGBUR NEGATIVE 06/13/2017 1430   BILIRUBINUR negative 06/17/2017 1145   BILIRUBINUR NEGATIVE 05/27/2014 1236   KETONESUR NEGATIVE 06/13/2017 1430   PROTEINUR negative 06/17/2017 1145   PROTEINUR NEGATIVE 06/13/2017 1430   UROBILINOGEN 0.2 06/17/2017 1145   NITRITE negative 06/17/2017 1145   NITRITE NEGATIVE 06/13/2017 1430   LEUKOCYTESUR Negative 06/17/2017 1145   LEUKOCYTESUR NEGATIVE 05/27/2014 1236    I have reviewed the labs  Assessment & Plan:    1. Testosterone deficiency - Most recent testosterone level is 433 ng/mL on 09/11/2018 (goal 450-600) - Continue Clomid 50 mg, 1/2 tablet daily; refills given - RTC in 12 months for HCT, testosterone, PSA, LFT's, ADAM and exam  2. BPH with LUTS - IPSS score is 13/2, it is worsening - Continue conservative  management, avoiding bladder irritants and timed voiding's - Continue tamsulosin 0.4 mg daily; refills given. - RTC in 6 months for IPSS, PSA and exam, as testosterone therapy can cause prostate enlargement and worsen LUTS  3. Erectile dysfunction:    - SHIM score is 20, it is improving - Script given for Sildenafil 20 mg, 3 to 5 tablets two hours prior to intercourse on an empty stomach, # 50; he is warned not to take medications that contain nitrates.  I also advised him of the side effects, such as: headache, flushing, dyspepsia, abnormal vision, nasal congestion, back pain, myalgia, nausea, dizziness, and rash. - RTC in 6 months for SHIM score and exam, as testosterone therapy can affect erections  4. Elevated PSA - To rule out it possibly being due to laboratory error, will have rechecked when he visits the [place] in 09/2018  Return in about 1 year (around 09/14/2019) for IPSS, SHIM, PSA and exam, PSA, LFT's, HCT, HBG, testosterone (before 10 AM).  Zara Council, PA-C  [facility] [address], [place], [place] [address] [phone]  I,  Adele Schilder, am acting as a Education administrator for [employer], PA-C.   I have reviewed the above documentation for accuracy and completeness, and I agree with the above.    Zara Council, PA-C

## 2018-09-13 ENCOUNTER — Ambulatory Visit (INDEPENDENT_AMBULATORY_CARE_PROVIDER_SITE_OTHER): Payer: BLUE CROSS/BLUE SHIELD | Admitting: Urology

## 2018-09-13 ENCOUNTER — Encounter: Payer: Self-pay | Admitting: Urology

## 2018-09-13 VITALS — BP 113/76 | HR 86 | Ht 70.0 in | Wt 202.0 lb

## 2018-09-13 DIAGNOSIS — N401 Enlarged prostate with lower urinary tract symptoms: Secondary | ICD-10-CM

## 2018-09-13 DIAGNOSIS — N529 Male erectile dysfunction, unspecified: Secondary | ICD-10-CM | POA: Diagnosis not present

## 2018-09-13 DIAGNOSIS — E349 Endocrine disorder, unspecified: Secondary | ICD-10-CM

## 2018-09-13 DIAGNOSIS — N138 Other obstructive and reflux uropathy: Secondary | ICD-10-CM

## 2018-09-13 MED ORDER — SILDENAFIL CITRATE 20 MG PO TABS: ORAL_TABLET | ORAL | 3 refills | Status: AC

## 2018-09-13 MED ORDER — TAMSULOSIN HCL 0.4 MG PO CAPS: 0.4000 mg | ORAL_CAPSULE | Freq: Every day | ORAL | 3 refills | Status: DC

## 2018-09-13 MED ORDER — CLOMIPHENE CITRATE 50 MG PO TABS: 25.0000 mg | ORAL_TABLET | Freq: Every day | ORAL | 3 refills | Status: DC

## 2018-09-14 ENCOUNTER — Telehealth: Payer: Self-pay

## 2018-09-14 NOTE — Telephone Encounter (Signed)
Sildenafil Rx sent to [facility]

## 2018-09-21 ENCOUNTER — Telehealth: Payer: Self-pay

## 2018-09-21 DIAGNOSIS — N138 Other obstructive and reflux uropathy: Secondary | ICD-10-CM

## 2018-09-21 DIAGNOSIS — N401 Enlarged prostate with lower urinary tract symptoms: Principal | ICD-10-CM

## 2018-09-21 NOTE — Telephone Encounter (Signed)
.  mychart

## 2018-09-22 ENCOUNTER — Other Ambulatory Visit: Payer: Self-pay

## 2018-09-25 ENCOUNTER — Other Ambulatory Visit: Payer: BLUE CROSS/BLUE SHIELD

## 2018-09-25 DIAGNOSIS — N138 Other obstructive and reflux uropathy: Secondary | ICD-10-CM

## 2018-09-25 DIAGNOSIS — N401 Enlarged prostate with lower urinary tract symptoms: Principal | ICD-10-CM

## 2018-09-26 LAB — PSA: Prostate Specific Ag, Serum: 0.7 ng/mL (ref 0.0–4.0)

## 2018-10-25 DIAGNOSIS — F4310 Post-traumatic stress disorder, unspecified: Secondary | ICD-10-CM | POA: Diagnosis not present

## 2018-10-25 DIAGNOSIS — J301 Allergic rhinitis due to pollen: Secondary | ICD-10-CM | POA: Diagnosis not present

## 2018-10-25 DIAGNOSIS — R51 Headache: Secondary | ICD-10-CM | POA: Diagnosis not present

## 2018-10-25 DIAGNOSIS — E039 Hypothyroidism, unspecified: Secondary | ICD-10-CM | POA: Diagnosis not present

## 2018-11-16 DIAGNOSIS — L820 Inflamed seborrheic keratosis: Secondary | ICD-10-CM | POA: Diagnosis not present

## 2018-11-16 DIAGNOSIS — D224 Melanocytic nevi of scalp and neck: Secondary | ICD-10-CM | POA: Diagnosis not present

## 2018-11-16 DIAGNOSIS — L821 Other seborrheic keratosis: Secondary | ICD-10-CM | POA: Diagnosis not present

## 2018-11-16 DIAGNOSIS — D2230 Melanocytic nevi of unspecified part of face: Secondary | ICD-10-CM | POA: Diagnosis not present

## 2018-11-16 DIAGNOSIS — L578 Other skin changes due to chronic exposure to nonionizing radiation: Secondary | ICD-10-CM | POA: Diagnosis not present

## 2018-12-25 ENCOUNTER — Other Ambulatory Visit: Payer: Self-pay

## 2018-12-25 ENCOUNTER — Ambulatory Visit (INDEPENDENT_AMBULATORY_CARE_PROVIDER_SITE_OTHER): Payer: BC Managed Care – PPO

## 2018-12-25 VITALS — BP 125/78 | HR 98

## 2018-12-25 DIAGNOSIS — N138 Other obstructive and reflux uropathy: Secondary | ICD-10-CM | POA: Diagnosis not present

## 2018-12-25 DIAGNOSIS — N401 Enlarged prostate with lower urinary tract symptoms: Secondary | ICD-10-CM

## 2018-12-25 LAB — URINALYSIS, COMPLETE
Bilirubin, UA: NEGATIVE
Ketones, UA: NEGATIVE
Leukocytes,UA: NEGATIVE
Nitrite, UA: NEGATIVE
Protein,UA: NEGATIVE
RBC, UA: NEGATIVE
Specific Gravity, UA: <1.005 — ABNORMAL LOW (ref 1.005–1.030)
Urobilinogen, Ur: 0.2 mg/dL (ref 0.2–1.0)
pH, UA: 6 (ref 5.0–7.5)

## 2018-12-25 LAB — BLADDER SCAN AMB NON-IMAGING

## 2018-12-25 LAB — MICROSCOPIC EXAMINATION
Bacteria, UA: NONE SEEN
RBC, Urine: NONE SEEN /hpf (ref 0–2)

## 2018-12-25 NOTE — Progress Notes (Signed)
Patient present today complaining of left groin pain and bladder pressure. Denies dysuria, frequency, urgency, fever, or chills.  PVR today was 27ml

## 2018-12-27 NOTE — Progress Notes (Signed)
Left pt mess to call 

## 2018-12-31 NOTE — Progress Notes (Signed)
09/13/2018 2:24 PM   Frank [name]. 1960/09/20 [id number]  Referring provider: Arnetha Courser, MD [address] [place],  [place] [address]  Chief Complaint  Patient presents with  . Hypogonadism    HPI: Frank [name]. is a 58 y.o. male Caucasian with testosterone deficiency, erectile dysfunction and BPH with LUTS who presents today for an urgent appointment.    He has been experiencing suprapubic pain that lateralizes to the left groin area for the last week.  It is accompanied by the worsening of frequency, straining to urinate and a weak urinary stream.    He presented to the office on 12/25/2018 with similar complaints.  UA at that time was bland and PVR was 65 mL.    The pain can reach intensities of 5-6/10, but it mostly stays at a 3.  The pain has waken him from sleep.  He finds that having a full bladder makes the pain worse and voiding relieves the pain somewhat although the pain will linger after voiding for several seconds.  He has been having issues with constipation, but he does not find relief with his symptoms after a BM.  Patient denies any gross hematuria, dysuria or suprapubic/flank pain.  Patient denies any fevers, chills, nausea or vomiting.   He has a remote history of uric acid stones.  KUB on 01/01/2019 did not note any stones.    PMH: Past Medical History:  Diagnosis Date  . Abdominal pain, right upper quadrant 09/17/2013  . Acid reflux   . Anxiety and depression   . Back pain 03/31/2015  . BPH (benign prostatic hyperplasia)   . BPH with obstruction/lower urinary tract symptoms 03/22/2015  . Frequency   . Hypogonadism in male   . Hypothyroidism   . Kidney cysts 09/17/2013  . Kidney lump 09/17/2013  . Over weight   . Overweight (BMI 25.0-29.9) 05/15/2015  . Preventative health care 03/31/2015  . Sleep apnea   . Testicular hypofunction   . Weak urinary stream     Surgical History: Past Surgical History:  Procedure Laterality Date  .  COLONOSCOPY  2014   Dr Allen Norris    Home Medications:  Allergies as of 01/01/2019   No Known Allergies     Medication List       Accurate as of January 01, 2019 11:59 PM. If you have any questions, ask your nurse or doctor.        STOP taking these medications   aspirin EC 81 MG tablet Stopped by: Jazzlin Clements, PA-C     TAKE these medications   clomiPHENE 50 MG tablet Commonly known as: CLOMID TAKE 1/2 (ONE-HALF) TABLET BY MOUTH ONCE DAILY   clomiPHENE 50 MG tablet Commonly known as: CLOMID Take 0.5 tablets (25 mg total) by mouth daily.   cyclobenzaprine 10 MG tablet Commonly known as: FLEXERIL Take 0.5-1 tablets (5-10 mg total) by mouth 3 (three) times daily as needed for muscle spasms.   levothyroxine 125 MCG tablet Commonly known as: SYNTHROID Take 1 tablet (125 mcg total) by mouth daily before breakfast.   naproxen sodium 220 MG tablet Commonly known as: ALEVE Take 220 mg by mouth 2 (two) times daily with a meal.   sertraline 50 MG tablet Commonly known as: ZOLOFT Take 50 mg by mouth daily.   sildenafil 20 MG tablet Commonly known as: REVATIO Take 3 to 5 tablets two hours before intercouse on an empty stomach.  Do not take with nitrates.  tamsulosin 0.4 MG Caps capsule Commonly known as: FLOMAX Take 1 capsule (0.4 mg total) by mouth daily.   topiramate 50 MG tablet Commonly known as: TOPAMAX   Vitamin D3 25 MCG (1000 UT) Caps Take by mouth.       Allergies: No Known Allergies  Family History: Family History  Problem Relation Age of Onset  . Stroke Mother   . Cancer Mother        breast  . Migraines Mother   . Breast cancer Mother   . Heart disease Unknown        uncle  . Congestive Heart Failure Maternal Grandmother   . Cancer Paternal Grandmother        throat  . Skin cancer Maternal Grandfather   . Prostate cancer Paternal Uncle   . Kidney disease Neg Hx   . Kidney cancer Neg Hx   . Bladder Cancer Neg Hx     Social History:   reports that he has never smoked. He quit smokeless tobacco use about 33 years ago. He reports that he does not drink alcohol or use drugs.  ROS: UROLOGY Frequent Urination?: Yes Hard to postpone urination?: No Burning/pain with urination?: No Get up at night to urinate?: No Leakage of urine?: No Urine stream starts and stops?: No Trouble starting stream?: No Do you have to strain to urinate?: Yes Blood in urine?: No Urinary tract infection?: No Sexually transmitted disease?: No Injury to kidneys or bladder?: No Painful intercourse?: No Weak stream?: Yes Erection problems?: No Penile pain?: No  Gastrointestinal Nausea?: No Vomiting?: No Indigestion/heartburn?: No Diarrhea?: No Constipation?: No  Constitutional Fever: No Night sweats?: No Weight loss?: No Fatigue?: No  Skin Skin rash/lesions?: No Itching?: No  Eyes Blurred vision?: No Double vision?: No  Ears/Nose/Throat Sore throat?: No Sinus problems?: No  Hematologic/Lymphatic Swollen glands?: No Easy bruising?: No  Cardiovascular Leg swelling?: No Chest pain?: No  Respiratory Cough?: No Shortness of breath?: No  Endocrine Excessive thirst?: No  Musculoskeletal Back pain?: Yes Joint pain?: No  Neurological Headaches?: Yes Dizziness?: No  Psychologic Depression?: No Anxiety?: No  Physical Exam: BP 127/78   Pulse 90   Ht 5\' 10"  (1.778 m)   Wt 207 lb (93.9 kg)   BMI 29.70 kg/m   Constitutional:  Well nourished. Alert and oriented, No acute distress. HEENT: [place] AT, moist mucus membranes.  Trachea midline, no masses. Cardiovascular: No clubbing, cyanosis, or edema. Respiratory: Normal respiratory effort, no increased work of breathing. GI: Abdomen is soft, non tender, non distended, no abdominal masses. No hernias appreciated.   GU: No CVA tenderness.  No bladder fullness or masses.  Patient with circumcised phallus.  Urethral meatus is patent.  No penile discharge. No penile lesions or  rashes. Scrotum without lesions, cysts, rashes and/or edema.  Testicles are located scrotally bilaterally. No masses are appreciated in the testicles. Left and right epididymis are normal. Rectal: Patient with  normal sphincter tone. Anus and perineum without scarring or rashes. No rectal masses are appreciated. Prostate is approximately 50 grams, no nodules are appreciated.  Skin: No rashes, bruises or suspicious lesions. Lymph: No inguinal adenopathy. Neurologic: Grossly intact, no focal deficits, moving all 4 extremities. Psychiatric: Normal mood and affect.  Laboratory Data: Lab Results  Component Value Date   WBC 7.3 04/12/2018   HGB 14.5 09/11/2018   HCT 43.4 09/11/2018   MCV 88.9 04/12/2018   PLT 243 04/12/2018    Lab Results  Component Value Date   CREATININE  1.31 04/12/2018    Previous PSA's:        0.2 ng/mL on 05/30/2014       0.3 ng/mL on 09/12/2015  0.3 ng/mL on 08/11/2016  0.4 ng/mL on 02/14/2017  0.4 ng/mL on 09/09/2017  2.4 ng/mL on 09/11/2018  0.7 ng/mL on 09/25/2018  Lab Results  Component Value Date   TESTOSTERONE 433 09/11/2018   No results found for: HGBA1C  Lab Results  Component Value Date   TSH 1.12 04/12/2018       Component Value Date/Time   CHOL 213 (H) 04/12/2018 0909   CHOL 205 (H) 03/31/2015 1535   HDL 50 04/12/2018 0909   HDL 44 03/31/2015 1535   CHOLHDL 4.3 04/12/2018 0909   VLDL 23 05/17/2016 1604   LDLCALC 143 (H) 04/12/2018 0909    Lab Results  Component Value Date   AST 13 09/11/2018   Lab Results  Component Value Date   ALT 11 09/11/2018   No components found for: ALKALINEPHOPHATASE No components found for: BILIRUBINTOTAL  No results found for: ESTRADIOL  Urinalysis    Component Value Date/Time   COLORURINE COLORLESS (A) 06/13/2017 1430   APPEARANCEUR Clear 01/01/2019 1545   LABSPEC 1.003 (L) 06/13/2017 1430   LABSPEC 1.010 05/27/2014 1236   PHURINE 6.0 06/13/2017 1430   GLUCOSEU Trace (A) 01/01/2019 1545    GLUCOSEU NEGATIVE 05/27/2014 1236   HGBUR NEGATIVE 06/13/2017 1430   BILIRUBINUR Negative 01/01/2019 1545   BILIRUBINUR NEGATIVE 05/27/2014 1236   KETONESUR NEGATIVE 06/13/2017 1430   PROTEINUR Negative 01/01/2019 1545   PROTEINUR NEGATIVE 06/13/2017 1430   UROBILINOGEN 0.2 06/17/2017 1145   NITRITE Negative 01/01/2019 1545   NITRITE NEGATIVE 06/13/2017 1430   LEUKOCYTESUR Negative 01/01/2019 1545   LEUKOCYTESUR NEGATIVE 05/27/2014 1236    I have reviewed the labs  Pertinent Imaging CLINICAL DATA:  Left lower quadrant pain.  EXAM: ABDOMEN - 1 VIEW  COMPARISON:  CT 06/17/2017.  FINDINGS: Soft tissue structures are unremarkable. Stool noted throughout the colon. No free air identified. Degenerative change thoracolumbar spine. Degenerative changes both hips.  IMPRESSION: 1.  Stool noted throughout the colon.  No bowel distention.  2.  No acute abnormality.   Electronically Signed   By: Marcello Moores  Register   On: 01/02/2019 08:34 I have independently reviewed the films and do not see any stones.    Assessment & Plan:    1. Left groin pain Discussed with patient that as he has had a history of uric acid stones it is possible that he has a stone but it is not see on the xray due to its composition - at this time, I offered a CT renal stone study or continue the flomax, send urine for culture and RTC in 1-2 weeks for a follow up KUB to see if a stone has migrated to an area that is visible on the x-ray - he would like to return in 1-2 weeks Patient is advised that if they should start to experience pain that is not able to be controlled with pain medication, intractable nausea and/or vomiting and/or fevers greater than 103 or shaking chills to contact the office immediately or seek treatment in the emergency department for emergent intervention.     Return in about 1 week (around 01/08/2019) for KUB and office visit .  Zara Council, PA-C  [facility] [address], [place], [place] [address] [phone]

## 2019-01-01 ENCOUNTER — Ambulatory Visit
Admission: RE | Admit: 2019-01-01 | Discharge: 2019-01-01 | Disposition: A | Discharge status: Home / Self Care | Payer: BC Managed Care – PPO | Source: Ambulatory Visit | Attending: Urology | Admitting: Urology

## 2019-01-01 ENCOUNTER — Other Ambulatory Visit: Payer: Self-pay

## 2019-01-01 ENCOUNTER — Ambulatory Visit (INDEPENDENT_AMBULATORY_CARE_PROVIDER_SITE_OTHER): Payer: BC Managed Care – PPO | Admitting: Urology

## 2019-01-01 ENCOUNTER — Encounter: Payer: Self-pay | Admitting: Urology

## 2019-01-01 VITALS — BP 127/78 | HR 90 | Ht 70.0 in | Wt 207.0 lb

## 2019-01-01 DIAGNOSIS — R1030 Lower abdominal pain, unspecified: Secondary | ICD-10-CM | POA: Diagnosis not present

## 2019-01-01 DIAGNOSIS — R3989 Other symptoms and signs involving the genitourinary system: Secondary | ICD-10-CM | POA: Diagnosis not present

## 2019-01-03 LAB — MICROSCOPIC EXAMINATION
Bacteria, UA: NONE SEEN
Epithelial Cells (non renal): NONE SEEN /hpf (ref 0–10)
RBC, Urine: NONE SEEN /hpf (ref 0–2)
WBC, UA: NONE SEEN /hpf (ref 0–5)

## 2019-01-03 LAB — URINALYSIS, COMPLETE
Bilirubin, UA: NEGATIVE
Ketones, UA: NEGATIVE
Leukocytes,UA: NEGATIVE
Nitrite, UA: NEGATIVE
Protein,UA: NEGATIVE
RBC, UA: NEGATIVE
Specific Gravity, UA: 1.02 (ref 1.005–1.030)
Urobilinogen, Ur: 0.2 mg/dL (ref 0.2–1.0)
pH, UA: 6 (ref 5.0–7.5)

## 2019-01-03 LAB — CULTURE, URINE COMPREHENSIVE

## 2019-01-05 DIAGNOSIS — Z03818 Encounter for observation for suspected exposure to other biological agents ruled out: Secondary | ICD-10-CM | POA: Diagnosis not present

## 2019-01-09 NOTE — Progress Notes (Signed)
09/13/2018 1:09 PM   [name]. 1961-04-20 [id number]  Referring provider: Arnetha Courser, MD [address] [place],  [place] [address]  Chief Complaint  Patient presents with  . Lower Abdominal Pain    HPI: Frank [name]. is a 58 y.o. male Caucasian with testosterone deficiency, erectile dysfunction and BPH with LUTS who presents today for one week follow up.  Background history He has been experiencing suprapubic pain that lateralizes to the left groin area for the last week.  It is accompanied by the worsening of frequency, straining to urinate and a weak urinary stream.  He presented to the office on 12/25/2018 with similar complaints.  UA at that time was bland and PVR was 65 mL.  The pain can reach intensities of 5-6/10, but it mostly stays at a 3.  The pain has waken him from sleep.  He finds that having a full bladder makes the pain worse and voiding relieves the pain somewhat although the pain will linger after voiding for several seconds.  He has been having issues with constipation, but he does not find relief with his symptoms after a BM.  Patient denies any gross hematuria, dysuria or suprapubic/flank pain.  Patient denies any fevers, chills, nausea or vomiting.  He has a remote history of uric acid stones.  KUB on 01/01/2019 did not note any stones.    Today, he states that pain has not improved or worsened.  It is still located in the left groin area.  He is still experiencing frequency, straining to urinate and a weak urinary stream.  He has not passed a fragment.  Patient denies any gross hematuria, dysuria or suprapubic/flank pain.  Patient denies any fevers, chills, nausea or vomiting.  KUB on 01/10/2019 did not identify a stone.  PMH: Past Medical History:  Diagnosis Date  . Abdominal pain, right upper quadrant 09/17/2013  . Acid reflux   . Anxiety and depression   . Back pain 03/31/2015  . BPH (benign prostatic hyperplasia)   . BPH with  obstruction/lower urinary tract symptoms 03/22/2015  . Frequency   . Hypogonadism in male   . Hypothyroidism   . Kidney cysts 09/17/2013  . Kidney lump 09/17/2013  . Over weight   . Overweight (BMI 25.0-29.9) 05/15/2015  . Preventative health care 03/31/2015  . Sleep apnea   . Testicular hypofunction   . Weak urinary stream     Surgical History: Past Surgical History:  Procedure Laterality Date  . COLONOSCOPY  2014   Dr Allen Norris    Home Medications:  Allergies as of 01/10/2019   No Known Allergies     Medication List       Accurate as of January 10, 2019 11:59 PM. If you have any questions, ask your nurse or doctor.        clomiPHENE 50 MG tablet Commonly known as: CLOMID TAKE 1/2 (ONE-HALF) TABLET BY MOUTH ONCE DAILY   clomiPHENE 50 MG tablet Commonly known as: CLOMID Take 0.5 tablets (25 mg total) by mouth daily.   cyclobenzaprine 10 MG tablet Commonly known as: FLEXERIL Take 0.5-1 tablets (5-10 mg total) by mouth 3 (three) times daily as needed for muscle spasms.   levothyroxine 125 MCG tablet Commonly known as: SYNTHROID Take 1 tablet (125 mcg total) by mouth daily before breakfast.   naproxen sodium 220 MG tablet Commonly known as: ALEVE Take 220 mg by mouth 2 (two) times daily with a meal.   sertraline  50 MG tablet Commonly known as: ZOLOFT Take 50 mg by mouth daily.   sildenafil 20 MG tablet Commonly known as: REVATIO Take 3 to 5 tablets two hours before intercouse on an empty stomach.  Do not take with nitrates.   tamsulosin 0.4 MG Caps capsule Commonly known as: FLOMAX Take 1 capsule (0.4 mg total) by mouth daily.   topiramate 50 MG tablet Commonly known as: TOPAMAX   Vitamin D3 25 MCG (1000 UT) Caps Take by mouth.       Allergies: No Known Allergies  Family History: Family History  Problem Relation Age of Onset  . Stroke Mother   . Cancer Mother        breast  . Migraines Mother   . Breast cancer Mother   . Heart disease Unknown         uncle  . Congestive Heart Failure Maternal Grandmother   . Cancer Paternal Grandmother        throat  . Skin cancer Maternal Grandfather   . Prostate cancer Paternal Uncle   . Kidney disease Neg Hx   . Kidney cancer Neg Hx   . Bladder Cancer Neg Hx     Social History:  reports that he has never smoked. He quit smokeless tobacco use about 33 years ago. He reports that he does not drink alcohol or use drugs.  ROS: UROLOGY Frequent Urination?: No Hard to postpone urination?: No Burning/pain with urination?: No Get up at night to urinate?: No Leakage of urine?: No Urine stream starts and stops?: No Trouble starting stream?: No Do you have to strain to urinate?: No Blood in urine?: No Urinary tract infection?: No Sexually transmitted disease?: No Injury to kidneys or bladder?: No Painful intercourse?: No Weak stream?: No Erection problems?: No Penile pain?: No  Gastrointestinal Nausea?: No Vomiting?: No Indigestion/heartburn?: No Diarrhea?: No Constipation?: No  Constitutional Fever: No Night sweats?: No Weight loss?: No Fatigue?: Yes  Skin Skin rash/lesions?: No Itching?: No  Eyes Blurred vision?: No Double vision?: No  Ears/Nose/Throat Sore throat?: No Sinus problems?: No  Hematologic/Lymphatic Swollen glands?: No Easy bruising?: No  Cardiovascular Leg swelling?: No Chest pain?: No  Respiratory Cough?: No Shortness of breath?: No  Endocrine Excessive thirst?: No  Musculoskeletal Back pain?: Yes Joint pain?: No  Neurological Headaches?: Yes Dizziness?: No  Psychologic Depression?: No Anxiety?: No  Physical Exam: BP 114/75 (BP Location: Left Arm, Patient Position: Sitting)   Pulse 93   Ht 5\' 10"  (1.778 m)   Wt 207 lb (93.9 kg)   BMI 29.70 kg/m   Constitutional:  Well nourished. Alert and oriented, No acute distress. HEENT: [place] AT, moist mucus membranes.  Trachea midline, no masses. Cardiovascular: No clubbing, cyanosis, or edema.  Respiratory: Normal respiratory effort, no increased work of breathing. Neurologic: Grossly intact, no focal deficits, moving all 4 extremities. Psychiatric: Normal mood and affect.  Laboratory Data: Lab Results  Component Value Date   WBC 7.3 04/12/2018   HGB 14.5 09/11/2018   HCT 43.4 09/11/2018   MCV 88.9 04/12/2018   PLT 243 04/12/2018    Lab Results  Component Value Date   CREATININE 1.31 04/12/2018    Previous PSA's:        0.2 ng/mL on 05/30/2014       0.3 ng/mL on 09/12/2015  0.3 ng/mL on 08/11/2016  0.4 ng/mL on 02/14/2017  0.4 ng/mL on 09/09/2017  2.4 ng/mL on 09/11/2018  0.7 ng/mL on 09/25/2018  Lab Results  Component Value Date  TESTOSTERONE 433 09/11/2018   No results found for: HGBA1C  Lab Results  Component Value Date   TSH 1.12 04/12/2018       Component Value Date/Time   CHOL 213 (H) 04/12/2018 0909   CHOL 205 (H) 03/31/2015 1535   HDL 50 04/12/2018 0909   HDL 44 03/31/2015 1535   CHOLHDL 4.3 04/12/2018 0909   VLDL 23 05/17/2016 1604   LDLCALC 143 (H) 04/12/2018 0909    Lab Results  Component Value Date   AST 13 09/11/2018   Lab Results  Component Value Date   ALT 11 09/11/2018   No components found for: ALKALINEPHOPHATASE No components found for: BILIRUBINTOTAL  No results found for: ESTRADIOL  Urinalysis    Component Value Date/Time   COLORURINE COLORLESS (A) 06/13/2017 1430   APPEARANCEUR Clear 01/01/2019 1545   LABSPEC 1.003 (L) 06/13/2017 1430   LABSPEC 1.010 05/27/2014 1236   PHURINE 6.0 06/13/2017 1430   GLUCOSEU Trace (A) 01/01/2019 1545   GLUCOSEU NEGATIVE 05/27/2014 1236   HGBUR NEGATIVE 06/13/2017 1430   BILIRUBINUR Negative 01/01/2019 1545   BILIRUBINUR NEGATIVE 05/27/2014 1236   KETONESUR NEGATIVE 06/13/2017 1430   PROTEINUR Negative 01/01/2019 1545   PROTEINUR NEGATIVE 06/13/2017 1430   UROBILINOGEN 0.2 06/17/2017 1145   NITRITE Negative 01/01/2019 1545   NITRITE NEGATIVE 06/13/2017 1430   LEUKOCYTESUR  Negative 01/01/2019 1545   LEUKOCYTESUR NEGATIVE 05/27/2014 1236    I have reviewed the labs  Pertinent Imaging CLINICAL DATA:  Flank pain.  EXAM: ABDOMEN - 1 VIEW  COMPARISON:  01/01/2019.  FINDINGS: Soft tissue structures are unremarkable. No bowel distention or free air. No definite evidence of nephrolithiasis or urolithiasis. Tiny sclerotic density noted the right femoral head and the left sacrum most consistent with a small bone islands. Similar findings noted on prior exam. No acute bony abnormality.  IMPRESSION: No acute abnormality.   Electronically Signed   By: Marcello Moores  Register   On: 01/10/2019 13:37 I have independently reviewed the films and do not see a stone.    Assessment & Plan:    1. Left groin pain Patient's pain has not changed and a stone has not presented itself on KUB's x 2.  He does have a history of uric acid stones and it may not show on plain films.  We will obtain a CT Renal stone study at this time for further evaluation. Patient is advised that if they should start to experience pain that is not able to be controlled with pain medication, intractable nausea and/or vomiting and/or fevers greater than 103 or shaking chills to contact the office immediately or seek treatment in the emergency department for emergent intervention.     Return for I will call patient with results.  Zara Council, PA-C  [facility] [address], [place], [place] [address] [phone]

## 2019-01-10 ENCOUNTER — Encounter: Payer: Self-pay | Admitting: Urology

## 2019-01-10 ENCOUNTER — Other Ambulatory Visit: Payer: Self-pay

## 2019-01-10 ENCOUNTER — Other Ambulatory Visit: Payer: Self-pay | Admitting: Urology

## 2019-01-10 ENCOUNTER — Ambulatory Visit
Admission: RE | Admit: 2019-01-10 | Discharge: 2019-01-10 | Disposition: A | Discharge status: Home / Self Care | Payer: BC Managed Care – PPO | Attending: Urology | Admitting: Urology

## 2019-01-10 ENCOUNTER — Ambulatory Visit
Admission: RE | Admit: 2019-01-10 | Discharge: 2019-01-10 | Disposition: A | Discharge status: Home / Self Care | Payer: BC Managed Care – PPO | Source: Ambulatory Visit | Attending: Urology | Admitting: Urology

## 2019-01-10 ENCOUNTER — Ambulatory Visit (INDEPENDENT_AMBULATORY_CARE_PROVIDER_SITE_OTHER): Payer: BC Managed Care – PPO | Admitting: Urology

## 2019-01-10 VITALS — BP 114/75 | HR 93 | Ht 70.0 in | Wt 207.0 lb

## 2019-01-10 DIAGNOSIS — R1030 Lower abdominal pain, unspecified: Secondary | ICD-10-CM | POA: Insufficient documentation

## 2019-01-24 ENCOUNTER — Other Ambulatory Visit: Payer: Self-pay

## 2019-01-24 ENCOUNTER — Ambulatory Visit
Admission: RE | Admit: 2019-01-24 | Discharge: 2019-01-24 | Disposition: A | Discharge status: Home / Self Care | Payer: BLUE CROSS/BLUE SHIELD | Source: Ambulatory Visit | Attending: Urology | Admitting: Urology

## 2019-01-24 DIAGNOSIS — R1030 Lower abdominal pain, unspecified: Secondary | ICD-10-CM | POA: Insufficient documentation

## 2019-01-24 DIAGNOSIS — N200 Calculus of kidney: Secondary | ICD-10-CM | POA: Diagnosis not present

## 2019-01-25 ENCOUNTER — Other Ambulatory Visit: Payer: Self-pay | Admitting: Urology

## 2019-01-25 DIAGNOSIS — N2889 Other specified disorders of kidney and ureter: Secondary | ICD-10-CM

## 2019-01-26 NOTE — Progress Notes (Unsigned)
Pt called to confirm that he wanted to get MRI.

## 2019-02-05 ENCOUNTER — Ambulatory Visit
Admission: RE | Admit: 2019-02-05 | Discharge: 2019-02-05 | Disposition: A | Discharge status: Home / Self Care | Payer: BLUE CROSS/BLUE SHIELD | Source: Ambulatory Visit | Attending: Urology | Admitting: Urology

## 2019-02-05 ENCOUNTER — Other Ambulatory Visit: Payer: Self-pay

## 2019-02-05 DIAGNOSIS — N2889 Other specified disorders of kidney and ureter: Secondary | ICD-10-CM

## 2019-02-05 MED ORDER — GADOBUTROL 1 MMOL/ML IV SOLN
9.0000 mL | Freq: Once | INTRAVENOUS | Status: AC | PRN
  Administered 2019-02-05: 9 mL via INTRAVENOUS

## 2019-02-12 ENCOUNTER — Other Ambulatory Visit: Payer: Self-pay | Admitting: Nurse Practitioner

## 2019-02-12 ENCOUNTER — Encounter: Payer: Self-pay | Admitting: Family Medicine

## 2019-02-12 DIAGNOSIS — R05 Cough: Secondary | ICD-10-CM

## 2019-02-12 DIAGNOSIS — R059 Cough, unspecified: Secondary | ICD-10-CM

## 2019-02-13 ENCOUNTER — Other Ambulatory Visit: Payer: Self-pay

## 2019-02-13 DIAGNOSIS — Z20822 Contact with and (suspected) exposure to covid-19: Secondary | ICD-10-CM

## 2019-02-15 LAB — NOVEL CORONAVIRUS, NAA: SARS-CoV-2, NAA: NOT DETECTED

## 2019-09-05 ENCOUNTER — Other Ambulatory Visit: Payer: Self-pay

## 2019-09-05 DIAGNOSIS — E291 Testicular hypofunction: Secondary | ICD-10-CM

## 2019-09-05 DIAGNOSIS — N529 Male erectile dysfunction, unspecified: Secondary | ICD-10-CM

## 2019-09-06 ENCOUNTER — Other Ambulatory Visit: Payer: BLUE CROSS/BLUE SHIELD

## 2019-09-11 ENCOUNTER — Other Ambulatory Visit

## 2019-09-14 ENCOUNTER — Ambulatory Visit: Payer: BLUE CROSS/BLUE SHIELD | Admitting: Urology

## 2019-09-17 ENCOUNTER — Other Ambulatory Visit

## 2019-09-18 ENCOUNTER — Encounter: Payer: Self-pay | Admitting: Urology

## 2019-09-18 ENCOUNTER — Ambulatory Visit (INDEPENDENT_AMBULATORY_CARE_PROVIDER_SITE_OTHER): Admitting: Urology

## 2019-09-18 ENCOUNTER — Other Ambulatory Visit: Payer: Self-pay

## 2019-09-18 VITALS — BP 121/80 | HR 84 | Ht 70.0 in | Wt 205.0 lb

## 2019-09-18 DIAGNOSIS — E349 Endocrine disorder, unspecified: Secondary | ICD-10-CM | POA: Diagnosis not present

## 2019-09-18 DIAGNOSIS — N138 Other obstructive and reflux uropathy: Secondary | ICD-10-CM

## 2019-09-18 DIAGNOSIS — N529 Male erectile dysfunction, unspecified: Secondary | ICD-10-CM

## 2019-09-18 DIAGNOSIS — N401 Enlarged prostate with lower urinary tract symptoms: Secondary | ICD-10-CM | POA: Diagnosis not present

## 2019-09-18 MED ORDER — TAMSULOSIN HCL 0.4 MG PO CAPS: 0.4000 mg | ORAL_CAPSULE | Freq: Every day | ORAL | 3 refills | Status: DC

## 2019-09-18 MED ORDER — CLOMIPHENE CITRATE 50 MG PO TABS: 25.0000 mg | ORAL_TABLET | Freq: Every day | ORAL | 3 refills | Status: DC

## 2019-09-18 NOTE — Progress Notes (Signed)
09/13/2018 10:35 AM   Frank [name]. 1960/09/17 [id number]  Referring provider: Arnetha Courser, MD [address] [place],  [place] [address]  Chief Complaint  Patient presents with  . Frank [name]    HPI: Frank [name]. is a 59 y.o. male with testosterone deficiency, erectile dysfunction and BPH with LUTS who presents today for a one year follow-up  Testosterone deficiency He is no longer having spontaneous erections at night.   He has sleep apnea and is sleeping with a CPAP machine.  His most recent testosterone level was 433 ng/mL on 09/11/2018.  He is currently managing his testosterone deficiency with Clomid 50 mg, 1/2 tablet daily.  His HCT and LFT's are normal.    BPH WITH LUTS  (prostate and/or bladder) IPSS score: 3/1    Previous score: 13/2  Major complaint(s):  Weak stream x several years. Denies any dysuria, hematuria or suprapubic pain.   Currently taking: Tamsulosin 0.4 mg daily.  His has been without tamsulosin for 2 weeks and has noted a worsening of his symptoms.  Otherwise, he denies any urinary symptoms.   Denies any recent fevers, chills, nausea or vomiting.  He does have a family history of PCa, with a paternal uncle having been diagnosed in his 77s; he passed away at 73 and patient is not sure what he died of.  He will talk to his paternal uncle's widow.  IPSS    Frank [name] 09/18/19 0900         International Prostate Symptom Score   How often have you had the sensation of not emptying your bladder?  Not at All     How often have you had to urinate less than every two hours?  Not at All     How often have you found you stopped and started again several times when you urinated?  Less than 1 in 5 times     How often have you found it difficult to postpone urination?  Less than 1 in 5 times     How often have you had a weak urinary stream?  Less than 1 in 5 times     How often have you had to strain to start urination?   Not at All     How many times did you typically get up at night to urinate?  None     Total IPSS Score  3       Quality of Life due to urinary symptoms   If you were to spend the rest of your life with your urinary condition just the way it is now how would you feel about that?  Pleased       Score:  1-7 Mild 8-19 Moderate 20-35 Severe  Erectile dysfunction His SHIM score is 20, which is mild ED.   His previous SHIM score was 22.  He has been having difficulty with erections for several years.  His major complaint is achieving an erection.  His libido is diminished.   His risk factors for ED are age, BPH, testosterone deficiency, hypothyroidism, sleep apnea, and depression.  He denies any painful erections or curvatures with his erections.  He is no longer having spontaneous erections.  He has tried sildenafil in the past; his current SHIM score is a mix of with and without sildenafil.  Frank [name] [name] 09/18/19 [phone]         SHIM: Over the last 6 months:  How do you rate your confidence that you could get and keep an erection?  Moderate     When you had erections with sexual stimulation, how often were your erections hard enough for penetration (entering your partner)?  Most Times (much more than half the time)     During sexual intercourse, how often were you able to maintain your erection after you had penetrated (entered) your partner?  Most Times (much more than half the time)     During sexual intercourse, how difficult was it to maintain your erection to completion of intercourse?  Slightly Difficult     When you attempted sexual intercourse, how often was it satisfactory for you?  Most Times (much more than half the time)       SHIM Total Score   SHIM  19       Score: 1-7 Severe ED 8-11 Moderate ED 12-16 Mild-Moderate ED 17-21 Mild ED 22-25 No ED  Patient had a fall in December 2018 where he landed flat on his back.  He had a CT scan in the ED which noted no cause of  the clinical presentation is identified. The patient does have a few tiny calcifications in the kidneys, none larger than 1 mm, but there is no evidence of passing stone or hydronephrosis.  No stone in the bladder.  No traumatic finding visible related to the described fall 1 week prior to his 09/14/2017 visit.  On his 09/14/2017 visit, he had an enlarged lymph node in his left groin and has been evaluated by general surgery.  He was still continuing to have the left hip pain, and was very anxious as his mother had just passed away from breast cancer and he was fearful this was a cancer.  He reported today (08/2018) that it was simply an 'angry' lymph node.  PMH: Past Medical History:  Diagnosis Date  . Abdominal pain, right upper quadrant 09/17/2013  . Acid reflux   . Anxiety and depression   . Back pain 03/31/2015  . BPH (Frank prostatic hyperplasia)   . BPH with obstruction/lower urinary tract symptoms 03/22/2015  . Frequency   . Hypogonadism in male   . Hypothyroidism   . Kidney cysts 09/17/2013  . Kidney lump 09/17/2013  . Over weight   . Overweight (BMI 25.0-29.9) 05/15/2015  . Preventative health care 03/31/2015  . Sleep apnea   . Testicular hypofunction   . Weak urinary stream     Surgical History: Past Surgical History:  Procedure Laterality Date  . COLONOSCOPY  2014   Dr Allen Norris    Home Medications:  Allergies as of 09/18/2019   No Known Allergies     Medication List       Accurate as of September 18, 2019 10:35 AM. If you have any questions, ask your nurse or doctor.        clomiPHENE 50 MG tablet Commonly known as: CLOMID Take 0.5 tablets (25 mg total) by mouth daily. What changed: Another medication with the same [name] was removed. Continue taking this medication, and follow the directions you see here. Changed by: Zara Council, PA-C   cyclobenzaprine 10 MG tablet Commonly known as: FLEXERIL Take 0.5-1 tablets (5-10 mg total) by mouth 3 (three) times daily as needed  for muscle spasms.   diclofenac 50 MG EC tablet Commonly known as: VOLTAREN   levothyroxine 125 MCG tablet Commonly known as: SYNTHROID Take 1 tablet (125 mcg total) by mouth daily before breakfast.   naproxen sodium  220 MG tablet Commonly known as: ALEVE Take 220 mg by mouth 2 (two) times daily with a meal.   sertraline 50 MG tablet Commonly known as: ZOLOFT Take 50 mg by mouth daily.   sildenafil 20 MG tablet Commonly known as: REVATIO Take 3 to 5 tablets two hours before intercouse on an empty stomach.  Do not take with nitrates.   tamsulosin 0.4 MG Caps capsule Commonly known as: FLOMAX Take 1 capsule (0.4 mg total) by mouth daily.   topiramate 50 MG tablet Commonly known as: TOPAMAX   Vitamin D3 25 MCG (1000 UT) Caps Take by mouth.       Allergies: No Known Allergies  Family History: Family History  Problem Relation Age of Onset  . Stroke Mother   . Cancer Mother        breast  . Migraines Mother   . Breast cancer Mother   . Heart disease Unknown        uncle  . Congestive Heart Failure Maternal Grandmother   . Cancer Paternal Grandmother        throat  . Skin cancer Maternal Grandfather   . Prostate cancer Paternal Uncle   . Kidney disease Neg Hx   . Kidney cancer Neg Hx   . Bladder Cancer Neg Hx     Social History:  reports that he has never smoked. He quit smokeless tobacco use about 34 years ago. He reports that he does not drink alcohol or use drugs.  ROS: For pertinent review of systems please refer to history of present illness Physical Exam: BP 121/80   Pulse 84   Ht 5\' 10"  (1.778 m)   Wt 205 lb (93 kg)   BMI 29.41 kg/m   Constitutional:  Well nourished. Alert and oriented, No acute distress. HEENT: [place] AT, mask in place.  Trachea midline, no masses. Cardiovascular: No clubbing, cyanosis, or edema. Respiratory: Normal respiratory effort, no increased work of breathing. GI: Abdomen is soft, non tender, non distended, no abdominal  masses.  GU: No CVA tenderness.  No bladder fullness or masses.  Patient with circumcised phallus.  Urethral meatus is patent.  No penile discharge. No penile lesions or rashes. Scrotum without lesions, cysts, rashes and/or edema.  Testicles are located scrotally bilaterally. No masses are appreciated in the testicles. Left and right epididymis are normal. Rectal: Patient with  normal sphincter tone. Anus and perineum without scarring or rashes. No rectal masses are appreciated. Prostate is approximately 45 grams, no nodules are appreciated. Seminal vesicles could not be palpated Skin: No rashes, bruises or suspicious lesions. Lymph: No inguinal adenopathy. Neurologic: Grossly intact, no focal deficits, moving all 4 extremities. Psychiatric: Normal mood and affect.   Laboratory Data: Lab Results  Component Value Date   WBC 7.3 04/12/2018   HGB 14.5 09/11/2018   HCT 43.4 09/11/2018   MCV 88.9 04/12/2018   PLT 243 04/12/2018    Lab Results  Component Value Date   CREATININE 1.31 04/12/2018    Previous PSA's:        0.2 ng/mL on 05/30/2014       0.3 ng/mL on 09/12/2015  0.3 ng/mL on 08/11/2016  0.4 ng/mL on 02/14/2017  0.4 ng/mL on 09/09/2017  2.4 ng/mL on 09/11/2018  Lab Results  Component Value Date   TESTOSTERONE 433 09/11/2018    Lab Results  Component Value Date   TSH 1.12 04/12/2018       Component Value Date/Time   CHOL 213 (H) 04/12/2018 [id number]  CHOL 205 (H) 03/31/2015 1535   HDL 50 04/12/2018 0909   HDL 44 03/31/2015 1535   CHOLHDL 4.3 04/12/2018 0909   VLDL 23 05/17/2016 1604   LDLCALC 143 (H) 04/12/2018 0909    Lab Results  Component Value Date   AST 13 09/11/2018   Lab Results  Component Value Date   ALT 11 09/11/2018   I have reviewed the labs  Assessment & Plan:    1. Testosterone deficiency - Testosterone, HCT, hemoglobin and LFT"s pending -if stable return in one year - Continue Clomid 50 mg, 1/2 tablet daily; refills given - RTC in 12  months for HCT, testosterone, PSA, LFT's and exam  2. BPH with LUTS - IPSS score is 3/1, it is improved - Continue conservative management, avoiding bladder irritants and timed voiding's - Continue tamsulosin 0.4 mg daily; refills given. - RTC in 6 months for IPSS, PSA and exam, as testosterone therapy can cause prostate enlargement and worsen LUTS  3. Erectile dysfunction:    - SHIM score is 19, it is slightly worse - RTC in 6 months for SHIM score and exam, as testosterone therapy can affect erections   Return in about 1 year (around 09/17/2020) for PSA, testosterone, LFT's H & H, I PSS, SHIM and exam .  Zara Council, [facility]  [place] [address] [place], [place] [address] [phone]

## 2019-09-19 LAB — HEPATIC FUNCTION PANEL
ALT: 33 IU/L (ref 0–44)
AST: 19 IU/L (ref 0–40)
Albumin: 4.2 g/dL (ref 3.8–4.9)
Alkaline Phosphatase: 90 IU/L (ref 39–117)
Bilirubin Total: 0.4 mg/dL (ref 0.0–1.2)
Bilirubin, Direct: 0.1 mg/dL (ref 0.00–0.40)
Total Protein: 6.3 g/dL (ref 6.0–8.5)

## 2019-09-19 LAB — PSA: Prostate Specific Ag, Serum: 0.4 ng/mL (ref 0.0–4.0)

## 2019-09-19 LAB — HEMATOCRIT: Hematocrit: 43.6 % (ref 37.5–51.0)

## 2019-09-19 LAB — HEMOGLOBIN: Hemoglobin: 14.7 g/dL (ref 13.0–17.7)

## 2019-09-19 LAB — TESTOSTERONE: Testosterone: 192 ng/dL — ABNORMAL LOW (ref 264–916)

## 2020-02-29 ENCOUNTER — Other Ambulatory Visit: Payer: Self-pay | Admitting: Physician Assistant

## 2020-02-29 DIAGNOSIS — N281 Cyst of kidney, acquired: Secondary | ICD-10-CM

## 2020-03-10 ENCOUNTER — Other Ambulatory Visit: Payer: Self-pay

## 2020-03-10 ENCOUNTER — Ambulatory Visit
Admission: RE | Admit: 2020-03-10 | Discharge: 2020-03-10 | Disposition: A | Discharge status: Home / Self Care | Source: Ambulatory Visit | Attending: Physician Assistant | Admitting: Physician Assistant

## 2020-03-10 DIAGNOSIS — N281 Cyst of kidney, acquired: Secondary | ICD-10-CM | POA: Insufficient documentation

## 2020-04-04 ENCOUNTER — Other Ambulatory Visit

## 2020-04-04 ENCOUNTER — Other Ambulatory Visit: Payer: Self-pay

## 2020-04-04 DIAGNOSIS — Z20822 Contact with and (suspected) exposure to covid-19: Secondary | ICD-10-CM

## 2020-04-07 ENCOUNTER — Ambulatory Visit
Admission: EM | Admit: 2020-04-07 | Discharge: 2020-04-07 | Disposition: A | Discharge status: Home / Self Care | Attending: Physician Assistant | Admitting: Physician Assistant

## 2020-04-07 ENCOUNTER — Other Ambulatory Visit: Payer: Self-pay

## 2020-04-07 DIAGNOSIS — R21 Rash and other nonspecific skin eruption: Secondary | ICD-10-CM

## 2020-04-07 LAB — NOVEL CORONAVIRUS, NAA: SARS-CoV-2, NAA: NOT DETECTED

## 2020-04-07 MED ORDER — TRIAMCINOLONE ACETONIDE 0.1 % EX CREA: 1.0000 "application " | TOPICAL_CREAM | Freq: Two times a day (BID) | CUTANEOUS | 0 refills | Status: AC

## 2020-04-07 NOTE — ED Triage Notes (Signed)
Patient complains of rash x2 days that he believes may be shingles. Reports the area in his left underarm appeared two days ago and the rash in his navel appeared yesterday.

## 2020-04-07 NOTE — Discharge Instructions (Addendum)
Your rash does not appear to be consistent with shingles.  The bumps are more consistent with some kind of insect bite or sting.  You can apply the topical triamcinolone cream twice a day as directed.  Keep the area clean and dry you may clean with soap and water.  Then apply the ointment.  Try not to scratch.  Take Claritin nondrowsy during the day and you can take Benadryl at night.  Please call us or come back if anything worsens or you develop spreading rash or there is never any associated pain or spreading redness.

## 2020-04-07 NOTE — ED Provider Notes (Signed)
[facility]    CSN: [id number] Arrival date & time: 04/07/20  1640      History   Chief Complaint Chief Complaint  Patient presents with  . Rash    HPI Frank Bourque. is a 59 y.o. male.   Patient presents for bumps/sores around the umbilicus that he noticed yesterday.  He says a couple days before that he noticed some red bumps in the left axillary area.  He says that they are very itchy, but denies any associated pain.  He admits that he is concerned about possible shingles.  He has not put anything on the rash.  He denies fever. He denies drainage from any of the bumps/sores.  Patient has not, to contact with any known allergens.  He has not used any new lotions or topicals on his skin.  He has not tried anything to treat this on his own.  There are no other concerns.  HPI  Past Medical History:  Diagnosis Date  . Abdominal pain, right upper quadrant 09/17/2013  . Acid reflux   . Anxiety and depression   . Back pain 03/31/2015  . BPH (benign prostatic hyperplasia)   . BPH with obstruction/lower urinary tract symptoms 03/22/2015  . Frequency   . Hypogonadism in male   . Hypothyroidism   . Kidney cysts 09/17/2013  . Kidney lump 09/17/2013  . Over weight   . Overweight (BMI 25.0-29.9) 05/15/2015  . Preventative health care 03/31/2015  . Sleep apnea   . Testicular hypofunction   . Weak urinary stream     Patient Active Problem List   Diagnosis Date Noted  . Chronic renal insufficiency, stage II (mild) 04/14/2018  . Closed fracture of distal end of radius 02/06/2018  . Degenerative joint disease of left hip 01/04/2018  . Left groin mass 07/13/2017  . Frequent headaches 01/28/2016  . Erectile dysfunction of organic origin 09/16/2015  . Overweight (BMI 25.0-29.9) 05/15/2015  . Preventative health care 03/31/2015  . Hypothyroidism 03/31/2015  . Back pain 03/31/2015  . BPH with obstruction/lower urinary tract symptoms 03/22/2015  . Hypogonadism in male 03/22/2015   . Abdominal pain, right upper quadrant 09/17/2013  . Kidney cysts 09/17/2013  . Kidney lump 09/17/2013    Past Surgical History:  Procedure Laterality Date  . COLONOSCOPY  2014   Dr Allen Norris       Home Medications    Prior to Admission medications   Medication Sig Start Date End Date Taking? Authorizing Provider  Cholecalciferol (VITAMIN D3) 1000 units CAPS Take by mouth.   Yes [provider]  clomiPHENE (CLOMID) 50 MG tablet Take 0.5 tablets (25 mg total) by mouth daily. 09/18/19  Yes McGowan, Larene Beach A, PA-C  levothyroxine (SYNTHROID, LEVOTHROID) 125 MCG tablet Take 1 tablet (125 mcg total) by mouth daily before breakfast. 04/14/18  Yes Lada, Satira Anis, MD  sertraline (ZOLOFT) 50 MG tablet Take 50 mg by mouth daily.  02/15/14  Yes [provider]  tamsulosin (FLOMAX) 0.4 MG CAPS capsule Take 1 capsule (0.4 mg total) by mouth daily. 09/18/19  Yes McGowan, Larene Beach A, PA-C  cyclobenzaprine (FLEXERIL) 10 MG tablet Take 0.5-1 tablets (5-10 mg total) by mouth 3 (three) times daily as needed for muscle spasms. 06/17/17   Hubbard Hartshorn, FNP  diclofenac (VOLTAREN) 50 MG EC tablet  08/28/19   [provider]  naproxen sodium (ANAPROX) 220 MG tablet Take 220 mg by mouth 2 (two) times daily with a meal.    [provider]  sildenafil (REVATIO) 20 MG tablet Take 3 to 5 tablets two hours before intercouse on an empty stomach.  Do not take with nitrates. 09/13/18   Zara Council A, PA-C  topiramate (TOPAMAX) 50 MG tablet  12/25/18   [provider]  triamcinolone cream (KENALOG) 0.1 % Apply 1 application topically 2 (two) times daily for 7 days. 04/07/20 04/14/20  Danton Clap, PA-C    Family History Family History  Problem Relation Age of Onset  . Stroke Mother   . Cancer Mother        breast  . Migraines Mother   . Breast cancer Mother   . Heart disease Unknown        uncle  . Congestive Heart Failure Maternal Grandmother   . Cancer Paternal  Grandmother        throat  . Skin cancer Maternal Grandfather   . Prostate cancer Paternal Uncle   . Kidney disease Neg Hx   . Kidney cancer Neg Hx   . Bladder Cancer Neg Hx     Social History Social History   Tobacco Use  . Smoking status: Never Smoker  . Smokeless tobacco: Former Network engineer  . Vaping Use: Never used  Substance Use Topics  . Alcohol use: No    Alcohol/week: 0.0 standard drinks  . Drug use: No     Allergies   Patient has no known allergies.   Review of Systems Review of Systems  Constitutional: Negative for fatigue and fever.  Musculoskeletal: Negative for arthralgias and myalgias.  Skin: Positive for color change and rash. Negative for wound.  Allergic/Immunologic: Negative for immunocompromised state.  Neurological: Negative for weakness.     Physical Exam Triage Vital Signs ED Triage Vitals  Enc Vitals Group     BP 04/07/20 1721 117/70     Pulse Rate 04/07/20 1721 72     Resp 04/07/20 1721 14     Temp 04/07/20 1721 98.1 F (36.7 C)     Temp src --      SpO2 04/07/20 1721 97 %     Weight --      Height --      Head Circumference --      Peak Flow --      Pain Score 04/07/20 1719 6     Pain Loc --      Pain Edu? --      Excl. in [place]? --    No data found.  Updated Vital Signs BP 117/70   Pulse 72   Temp 98.1 F (36.7 C)   Resp 14   SpO2 97%      Physical Exam Vitals and nursing note reviewed.  Constitutional:      General: He is not in acute distress.    Appearance: Normal appearance. He is well-developed. He is not ill-appearing or toxic-appearing.  HENT:     Head: Normocephalic and atraumatic.  Eyes:     General: No scleral icterus.    Conjunctiva/sclera: Conjunctivae normal.  Cardiovascular:     Rate and Rhythm: Normal rate and regular rhythm.     Heart sounds: No murmur heard.   Pulmonary:     Effort: Pulmonary effort is normal. No respiratory distress.     Breath sounds: Normal breath sounds.  Abdominal:       Palpations: Abdomen is soft.     Tenderness: There is no abdominal tenderness.  Musculoskeletal:     Cervical back: Neck supple.  Skin:    General: Skin is warm and dry.     Findings: Rash present.     Comments: Of the left axillary region there multiple scattered erythematous papules that are nontender.  They are not clustered together.  Of the umbilicus, there are multiple nontender erythematous papules as well.  Neurological:     General: No focal deficit present.     Mental Status: He is alert. Mental status is at baseline.     Motor: No weakness.     Gait: Gait normal.  Psychiatric:        Mood and Affect: Mood normal.        Behavior: Behavior normal.        Thought Content: Thought content normal.      UC Treatments / Results  Labs (all labs ordered are listed, but only abnormal results are displayed) Labs Reviewed - No data to display  EKG   Radiology No results found.  Procedures Procedures (including critical care time)  Medications Ordered in UC Medications - No data to display  Initial Impression / Assessment and Plan / UC Course  I have reviewed the triage vital signs and the nursing notes.  Pertinent labs & imaging results that were available during my care of the patient were reviewed by me and considered in my medical decision making (see chart for details).   Advised patient that his rash is not consistent with shingles.  It is nontender and does not classically look like shingles.  Advised him that it seems like some sort of insect bite or sting.  Advised him to apply triamcinolone cream twice a day and try not to scratch.  He can take Benadryl as well.  He should follow-up if anything is worsening or if he develops any new symptoms.   Final Clinical Impressions(s) / UC Diagnoses   Final diagnoses:  Rash and nonspecific skin eruption     Discharge Instructions     Your rash does not appear to be consistent with shingles.  The bumps are  more consistent with some kind of insect bite or sting.  You can apply the topical triamcinolone cream twice a day as directed.  Keep the area clean and dry you may clean with soap and water.  Then apply the ointment.  Try not to scratch.  Take Claritin nondrowsy during the day and you can take Benadryl at night.  Please call us or come back if anything worsens or you develop spreading rash or there is never any associated pain or spreading redness.    ED Prescriptions    Medication Sig Dispense Auth. Provider   triamcinolone cream (KENALOG) 0.1 % Apply 1 application topically 2 (two) times daily for 7 days. 30 g Gretta Cool     PDMP not reviewed this encounter.   Danton Clap, PA-C 04/08/20 [id number]

## 2020-04-15 ENCOUNTER — Other Ambulatory Visit: Payer: Self-pay

## 2020-04-15 ENCOUNTER — Emergency Department

## 2020-04-15 ENCOUNTER — Emergency Department
Admission: EM | Admit: 2020-04-15 | Discharge: 2020-04-15 | Disposition: A | Discharge status: Home / Self Care | Attending: Emergency Medicine | Admitting: Emergency Medicine

## 2020-04-15 DIAGNOSIS — Z79890 Hormone replacement therapy (postmenopausal): Secondary | ICD-10-CM | POA: Diagnosis not present

## 2020-04-15 DIAGNOSIS — W228XXA Striking against or struck by other objects, initial encounter: Secondary | ICD-10-CM | POA: Diagnosis not present

## 2020-04-15 DIAGNOSIS — S0990XA Unspecified injury of head, initial encounter: Secondary | ICD-10-CM | POA: Diagnosis not present

## 2020-04-15 DIAGNOSIS — E039 Hypothyroidism, unspecified: Secondary | ICD-10-CM | POA: Diagnosis not present

## 2020-04-15 DIAGNOSIS — Y9289 Other specified places as the place of occurrence of the external cause: Secondary | ICD-10-CM | POA: Insufficient documentation

## 2020-04-15 DIAGNOSIS — Y9389 Activity, other specified: Secondary | ICD-10-CM | POA: Insufficient documentation

## 2020-04-15 MED ORDER — CYCLOBENZAPRINE HCL 5 MG PO TABS: ORAL_TABLET | ORAL | 0 refills | Status: AC

## 2020-04-15 MED ORDER — IBUPROFEN 400 MG PO TABS: 400.0000 mg | ORAL_TABLET | Freq: Four times a day (QID) | ORAL | 0 refills | Status: AC | PRN

## 2020-04-15 MED ORDER — TRAMADOL HCL 50 MG PO TABS
50.0000 mg | ORAL_TABLET | Freq: Once | ORAL | Status: AC
  Administered 2020-04-15: 50 mg via ORAL
  Filled 2020-04-15: qty 1

## 2020-04-15 NOTE — ED Notes (Signed)
Pt states he stepped on rotten board and hit head on cinder block. A&O x 4. Speaking in clear complete sentences. Head wrapped. Denies blood thinner use.

## 2020-04-15 NOTE — ED Provider Notes (Signed)
[facility] Emergency Department Provider Note  ____________________________________________  Time seen: Approximately 4:02 PM  I have reviewed the triage vital signs and the nursing notes.   HISTORY  Chief Complaint Head Injury    HPI Frank [name]. is a 59 y.o. male that presents to the emergency department for a head injury.  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  He hit his forehead on a cinder block during the fall.  He did not lose consciousness.  He has swelling to his forehead with a headache since.  He is sore all over.  He has been ambulatory. No dizziness, visual changes, SOB, CP, vomiting, abdominal pain.  Past Medical History:  Diagnosis Date  . Abdominal pain, right upper quadrant 09/17/2013  . Acid reflux   . Anxiety and depression   . Back pain 03/31/2015  . BPH (benign prostatic hyperplasia)   . BPH with obstruction/lower urinary tract symptoms 03/22/2015  . Frequency   . Hypogonadism in male   . Hypothyroidism   . Kidney cysts 09/17/2013  . Kidney lump 09/17/2013  . Over weight   . Overweight (BMI 25.0-29.9) 05/15/2015  . Preventative health care 03/31/2015  . Sleep apnea   . Testicular hypofunction   . Weak urinary stream     Patient Active Problem List   Diagnosis Date Noted  . Chronic renal insufficiency, stage II (mild) 04/14/2018  . Closed fracture of distal end of radius 02/06/2018  . Degenerative joint disease of left hip 01/04/2018  . Left groin mass 07/13/2017  . Frequent headaches 01/28/2016  . Erectile dysfunction of organic origin 09/16/2015  . Overweight (BMI 25.0-29.9) 05/15/2015  . Preventative health care 03/31/2015  . Hypothyroidism 03/31/2015  . Back pain 03/31/2015  . BPH with obstruction/lower urinary tract symptoms 03/22/2015  . Hypogonadism in male 03/22/2015  . Abdominal pain, right upper quadrant 09/17/2013  . Kidney cysts 09/17/2013  . Kidney  lump 09/17/2013    Past Surgical History:  Procedure Laterality Date  . COLONOSCOPY  2014   Dr Allen Norris    Prior to Admission medications   Medication Sig Start Date End Date Taking? Authorizing Provider  Cholecalciferol (VITAMIN D3) 1000 units CAPS Take by mouth.    [provider]  clomiPHENE (CLOMID) 50 MG tablet Take 0.5 tablets (25 mg total) by mouth daily. 09/18/19   Zara Council A, PA-C  cyclobenzaprine (FLEXERIL) 5 MG tablet Take 1-2 tablets 3 times daily as needed 04/15/20   Laban Emperor, PA-C  diclofenac (VOLTAREN) 50 MG EC tablet  08/28/19   [provider]  ibuprofen (ADVIL) 400 MG tablet Take 1 tablet (400 mg total) by mouth every 6 (six) hours as needed for up to 2 days. 04/15/20 04/17/20  Laban Emperor, PA-C  levothyroxine (SYNTHROID, LEVOTHROID) 125 MCG tablet Take 1 tablet (125 mcg total) by mouth daily before breakfast. 04/14/18   Lada, Satira Anis, MD  naproxen sodium (ANAPROX) 220 MG tablet Take 220 mg by mouth 2 (two) times daily with a meal.    [provider]  sertraline (ZOLOFT) 50 MG tablet Take 50 mg by mouth daily.  02/15/14   [provider]  sildenafil (REVATIO) 20 MG tablet Take 3 to 5 tablets two hours before intercouse on an empty stomach.  Do not take with nitrates. 09/13/18   Zara Council A, PA-C  tamsulosin (FLOMAX) 0.4 MG CAPS capsule Take 1 capsule (0.4 mg total)  by mouth daily. 09/18/19   Zara Council A, PA-C  topiramate (TOPAMAX) 50 MG tablet  12/25/18   [provider]    Allergies Patient has no known allergies.  Family History  Problem Relation Age of Onset  . Stroke Mother   . Cancer Mother        breast  . Migraines Mother   . Breast cancer Mother   . Heart disease Other        uncle  . Congestive Heart Failure Maternal Grandmother   . Cancer Paternal Grandmother        throat  . Skin cancer Maternal Grandfather   . Prostate cancer Paternal Uncle   . Kidney disease Neg Hx   . Kidney cancer  Neg Hx   . Bladder Cancer Neg Hx     Social History Social History   Tobacco Use  . Smoking status: Never Smoker  . Smokeless tobacco: Former Network engineer  . Vaping Use: Never used  Substance Use Topics  . Alcohol use: No    Alcohol/week: 0.0 standard drinks  . Drug use: No     Review of Systems  Cardiovascular: No chest pain. Respiratory: No SOB. Gastrointestinal: No abdominal pain.  No nausea, no vomiting.  Musculoskeletal: Positive for generalized soreness. Skin: Negative for rash, abrasions, lacerations, ecchymosis. Neurological: Negative for numbness or tingling.  Positive for headache.   ____________________________________________   PHYSICAL EXAM:  VITAL SIGNS: ED Triage Vitals  Enc Vitals Group     BP 04/15/20 1426 113/72     Pulse Rate 04/15/20 1426 78     Resp 04/15/20 1426 16     Temp 04/15/20 1426 98.6 F (37 C)     Temp Source 04/15/20 1426 Oral     SpO2 04/15/20 1426 96 %     Weight 04/15/20 1427 206 lb (93.4 kg)     Height 04/15/20 1427 5\' 10"  (1.778 m)     Head Circumference --      Peak Flow --      Pain Score 04/15/20 1427 7     Pain Loc --      Pain Edu? --      Excl. in [place]? --      Constitutional: Alert and oriented. Well appearing and in no acute distress. Eyes: Conjunctivae are normal. PERRL. EOMI. Head: Swelling with overlying abrasion to forehead. ENT:      Ears:      Nose: No congestion/rhinnorhea.      Mouth/Throat: Mucous membranes are moist.  Neck: No stridor. No cervical spine tenderness to palpation. Cardiovascular: Normal rate, regular rhythm.  Good peripheral circulation. Respiratory: Normal respiratory effort without tachypnea or retractions. Lungs CTAB. Good air entry to the bases with no decreased or absent breath sounds. Gastrointestinal: Bowel sounds 4 quadrants. Soft and nontender to palpation. No guarding or rigidity. No palpable masses. No distention.  Musculoskeletal: Full range of motion to all  extremities. No gross deformities appreciated. Normal gait. Neurologic:  Normal speech and language. No gross focal neurologic deficits are appreciated.  Skin:  Skin is warm, dry and intact.  Psychiatric: Mood and affect are normal. Speech and behavior are normal. Patient exhibits appropriate insight and judgement.   ____________________________________________   LABS (all labs ordered are listed, but only abnormal results are displayed)  Labs Reviewed - No data to display ____________________________________________  EKG   ____________________________________________  RADIOLOGY Robinette Haines, personally viewed and evaluated these images (plain radiographs) as part of my  medical decision making, as well as reviewing the written report by the radiologist.  CT Head Wo Contrast  Result Date: 04/15/2020 CLINICAL DATA:  Head trauma, abnormal mental status. EXAM: CT HEAD WITHOUT CONTRAST TECHNIQUE: Contiguous axial images were obtained from the base of the skull through the vertex without intravenous contrast. COMPARISON:  MRA head 05/19/2017.  Brain MRI 02/01/2016. FINDINGS: Brain: Cerebral volume is normal. There is no acute intracranial hemorrhage. No demarcated cortical infarct. No extra-axial fluid collection. No evidence of intracranial mass. No midline shift. Vascular: No hyperdense vessel. Skull: Normal. Negative for fracture or focal lesion. Sinuses/Orbits: Visualized orbits show no acute finding. Mild ethmoid sinus mucosal thickening. No significant mastoid effusion. Other: Frontal scalp/forehead hematoma with small subcutaneous foci of gas suggestive of laceration at this site IMPRESSION: No evidence of acute intracranial abnormality. Frontal scalp/forehead hematoma with small subcutaneous foci of gas suggestive of associated laceration at this site. Mild ethmoid sinus mucosal thickening. Electronically Signed   By: Kellie Simmering DO   On: 04/15/2020 15:16   CT Cervical Spine Wo  Contrast  Result Date: 04/15/2020 CLINICAL DATA:  Pain following fall EXAM: CT CERVICAL SPINE WITHOUT CONTRAST TECHNIQUE: Multidetector CT imaging of the cervical spine was performed without intravenous contrast. Multiplanar CT image reconstructions were also generated. COMPARISON:  June 13, 2017 FINDINGS: Alignment: There is no demonstrable spondylolisthesis. Skull base and vertebrae: Skull base and craniocervical junction regions appear normal. No evident fracture. No blastic or lytic bone lesions. Soft tissues and spinal canal: Prevertebral soft tissues and predental space regions are normal. There is no evident cord or canal hematoma. No paraspinous lesions. Disc levels: No appreciable disc space narrowing. Anterior osteophytes are noted at C4, C5, C6, and C7, essentially stable in appearance compared to prior study. There is facet hypertrophy at several levels. There is central disc protrusion at C2-3 without nerve root edema or effacement. No nerve root edema or effacement elsewhere. No disc extrusion or stenosis. Upper chest: Visualized upper lung regions are clear. Other: None IMPRESSION: No demonstrable fracture or spondylolisthesis. Stable arthropathy at several levels. No disc extrusion or stenosis. Electronically Signed   By: Lowella Grip III M.D.   On: 04/15/2020 17:30    ____________________________________________    PROCEDURES  Procedure(s) performed:    Procedures    Medications  traMADol (ULTRAM) tablet 50 mg (50 mg Oral Given 04/15/20 1645)     ____________________________________________   INITIAL IMPRESSION / ASSESSMENT AND PLAN / ED COURSE  Pertinent labs & imaging results that were available during my care of the patient were reviewed by me and considered in my medical decision making (see chart for details).  Review of the [place] CSRS was performed in accordance of the [place] prior to dispensing any controlled drugs.   Patient presented to emergency  department for evaluation of head injury.  Vital signs and exam are reassuring.  Head CT and cervical CT are negative for acute abnormalities. Pain improved following tramadol. Patient will be discharged home with prescriptions for ibuprofen and flexeril. Patient is to follow up with primary care as directed. Patient is given ED precautions to return to the ED for any worsening or new symptoms.   Frank [name]. was evaluated in Emergency Department on 04/15/2020 for the symptoms described in the history of present illness. He was evaluated in the context of the global COVID-19 pandemic, which necessitated consideration that the patient might be at risk for infection with the SARS-CoV-2 virus that causes COVID-19. Institutional protocols and  algorithms that pertain to the evaluation of patients at risk for COVID-19 are in a state of rapid change based on information released by regulatory bodies including the CDC and federal and state organizations. These policies and algorithms were followed during the patient's care in the ED.  ____________________________________________  FINAL CLINICAL IMPRESSION(S) / ED DIAGNOSES  Final diagnoses:  Injury of head, initial encounter      NEW MEDICATIONS STARTED DURING THIS VISIT:  ED Discharge Orders         Ordered    cyclobenzaprine (FLEXERIL) 5 MG tablet        04/15/20 1804    ibuprofen (ADVIL) 400 MG tablet  Every 6 hours PRN        04/15/20 1804              This chart was dictated using voice recognition software/Dragon. Despite best efforts to proofread, errors can occur which can change the meaning. Any change was purely unintentional.    Laban Emperor, PA-C 04/15/20 1826    Lavonia Drafts, MD 04/15/20 [phone]

## 2020-04-15 NOTE — ED Notes (Signed)
Pt taken to CT.

## 2020-04-15 NOTE — ED Triage Notes (Signed)
Pt comes into the ED via EMS from home, states he was working on his deck and fell through a rotten piece and fell forward hitting his forehead on a cinderblock, pt has a hematoma, denies LOC, but is not quite feeling himself, sleep. No neuro deficit, 117/66, 97RA, 95HR, CBg 137, #18Lwrist.

## 2020-06-04 MED ORDER — PROPOFOL 10 MG/ML IV BOLUS
INTRAVENOUS | Status: AC
  Filled 2020-06-04: qty 40

## 2020-06-04 MED ORDER — PROPOFOL 500 MG/50ML IV EMUL
INTRAVENOUS | Status: AC
  Filled 2020-06-04: qty 300

## 2020-09-09 ENCOUNTER — Other Ambulatory Visit: Payer: Self-pay | Admitting: *Deleted

## 2020-09-09 DIAGNOSIS — E349 Endocrine disorder, unspecified: Secondary | ICD-10-CM

## 2020-09-09 DIAGNOSIS — N138 Other obstructive and reflux uropathy: Secondary | ICD-10-CM

## 2020-09-12 ENCOUNTER — Other Ambulatory Visit: Payer: Self-pay

## 2020-09-15 ENCOUNTER — Encounter: Payer: Self-pay | Admitting: Urology

## 2020-09-17 ENCOUNTER — Ambulatory Visit: Payer: Self-pay | Admitting: Urology

## 2020-09-19 ENCOUNTER — Other Ambulatory Visit: Payer: Self-pay

## 2020-09-19 ENCOUNTER — Other Ambulatory Visit

## 2020-09-19 DIAGNOSIS — E349 Endocrine disorder, unspecified: Secondary | ICD-10-CM

## 2020-09-19 DIAGNOSIS — N138 Other obstructive and reflux uropathy: Secondary | ICD-10-CM

## 2020-09-20 LAB — HEPATIC FUNCTION PANEL
ALT: 14 IU/L (ref 0–44)
AST: 17 IU/L (ref 0–40)
Albumin: 4.2 g/dL (ref 3.8–4.9)
Alkaline Phosphatase: 77 IU/L (ref 44–121)
Bilirubin Total: 0.5 mg/dL (ref 0.0–1.2)
Bilirubin, Direct: 0.11 mg/dL (ref 0.00–0.40)
Total Protein: 6.5 g/dL (ref 6.0–8.5)

## 2020-09-20 LAB — HEMOGLOBIN AND HEMATOCRIT, BLOOD
Hematocrit: 44.6 % (ref 37.5–51.0)
Hemoglobin: 14.7 g/dL (ref 13.0–17.7)

## 2020-09-20 LAB — TESTOSTERONE: Testosterone: 462 ng/dL (ref 264–916)

## 2020-09-20 LAB — PSA: Prostate Specific Ag, Serum: 0.4 ng/mL (ref 0.0–4.0)

## 2020-09-24 NOTE — Progress Notes (Signed)
09/13/2018 9:58 AM   Frank [name]. 1960/12/14 [id number]  Referring provider: No referring provider defined for this encounter.  Chief Complaint  Patient presents with  . Hypogonadism  . Erectile Dysfunction   Urological history: 1. Testosterone deficiency - testosterone level 462 in 09/2020 - managed with Clomid 50 mg, 1/2 daily  2. BPH with LU TS - PSA 0.4 in 09/2020 - I PSS 3/1 - managed with tamsulosin 0.4 mg daily  3. ED - SHIM 20 - contributory factors of age, BPH, testosterone deficiency, hypothyroidism, sleep apnea, and depression - managed with sildenafil 20 mg, 3 to 5 tablets as needed for ED  4. Family history of prostate cancer - paternal uncle having been diagnosed in his 46s; he passed away at 65    HPI: Frank [name]. is a 60 y.o. male with who presents today for a one year follow-up  Patient denies any modifying or aggravating factors.  Patient denies any gross hematuria, dysuria or suprapubic/flank pain.  Patient denies any fevers, chills, nausea or vomiting.     IPSS    Row Name 09/25/20 0900         International Prostate Symptom Score   How often have you had the sensation of not emptying your bladder? Less than 1 in 5     How often have you had to urinate less than every two hours? Not at All     How often have you found you stopped and started again several times when you urinated? Less than 1 in 5 times     How often have you found it difficult to postpone urination? Not at All     How often have you had a weak urinary stream? Less than 1 in 5 times     How often have you had to strain to start urination? Not at All     How many times did you typically get up at night to urinate? None     Total IPSS Score 3           Quality of Life due to urinary symptoms   If you were to spend the rest of your life with your urinary condition just the way it is now how would you feel about that? Pleased           Score:  1-7 Mild 8-19  Moderate 20-35 Severe  Patient still having spontaneous erections.   He denies any pain or curvature with erections.    [place] Name 09/25/20 0931         SHIM: Over the last 6 months:   How do you rate your confidence that you could get and keep an erection? High     When you had erections with sexual stimulation, how often were your erections hard enough for penetration (entering your partner)? Most Times (much more than half the time)     During sexual intercourse, how often were you able to maintain your erection after you had penetrated (entered) your partner? Most Times (much more than half the time)     During sexual intercourse, how difficult was it to maintain your erection to completion of intercourse? Slightly Difficult     When you attempted sexual intercourse, how often was it satisfactory for you? Most Times (much more than half the time)           SHIM Total Score   SHIM 20  Score: 1-7 Severe ED 8-11 Moderate ED 12-16 Mild-Moderate ED 17-21 Mild ED 22-25 No ED   PMH: Past Medical History:  Diagnosis Date  . Abdominal pain, right upper quadrant 09/17/2013  . Acid reflux   . Anxiety and depression   . Back pain 03/31/2015  . BPH (benign prostatic hyperplasia)   . BPH with obstruction/lower urinary tract symptoms 03/22/2015  . Frequency   . Hypogonadism in male   . Hypothyroidism   . Kidney cysts 09/17/2013  . Kidney lump 09/17/2013  . Over weight   . Overweight (BMI 25.0-29.9) 05/15/2015  . Preventative health care 03/31/2015  . Sleep apnea   . Testicular hypofunction   . Weak urinary stream     Surgical History: Past Surgical History:  Procedure Laterality Date  . COLONOSCOPY  2014   Dr Allen Norris    Home Medications:  Allergies as of 09/25/2020   No Known Allergies     Medication List       Accurate as of September 25, 2020  9:58 AM. If you have any questions, ask your nurse or doctor.        clomiPHENE 50 MG tablet Commonly known as:  CLOMID Take 0.5 tablets (25 mg total) by mouth daily.   cyclobenzaprine 5 MG tablet Commonly known as: FLEXERIL Take 1-2 tablets 3 times daily as needed   diclofenac 50 MG EC tablet Commonly known as: VOLTAREN   levothyroxine 125 MCG tablet Commonly known as: SYNTHROID Take 1 tablet (125 mcg total) by mouth daily before breakfast.   naproxen sodium 220 MG tablet Commonly known as: ALEVE Take 220 mg by mouth 2 (two) times daily with a meal.   sertraline 50 MG tablet Commonly known as: ZOLOFT Take 50 mg by mouth daily.   sildenafil 20 MG tablet Commonly known as: REVATIO Take 3 to 5 tablets two hours before intercouse on an empty stomach.  Do not take with nitrates.   tamsulosin 0.4 MG Caps capsule Commonly known as: FLOMAX Take 1 capsule (0.4 mg total) by mouth daily.   topiramate 50 MG tablet Commonly known as: TOPAMAX   Vitamin D3 25 MCG (1000 UT) Caps Take by mouth.       Allergies: No Known Allergies  Family History: Family History  Problem Relation Age of Onset  . Stroke Mother   . Cancer Mother        breast  . Migraines Mother   . Breast cancer Mother   . Heart disease Other        uncle  . Congestive Heart Failure Maternal Grandmother   . Cancer Paternal Grandmother        throat  . Skin cancer Maternal Grandfather   . Prostate cancer Paternal Uncle   . Kidney disease Neg Hx   . Kidney cancer Neg Hx   . Bladder Cancer Neg Hx     Social History:  reports that he has never smoked. He quit smokeless tobacco use about 35 years ago. He reports that he does not drink alcohol and does not use drugs.  ROS: For pertinent review of systems please refer to history of present illness  Physical Exam: BP 115/77   Pulse 76   Ht 5\' 10"  (1.778 m)   Wt 206 lb (93.4 kg)   BMI 29.56 kg/m   Constitutional:  Well nourished. Alert and oriented, No acute distress. HEENT: [place] AT, mask in place.  Trachea midline Cardiovascular: No clubbing, cyanosis, or  edema. Respiratory: Normal respiratory effort,  no increased work of breathing. GU: No CVA tenderness.  No bladder fullness or masses.  Patient with circumcised phallus.  Urethral meatus is patent.  No penile discharge. No penile lesions or rashes. Scrotum without lesions, cysts, rashes and/or edema.  Testicles are located scrotally bilaterally. No masses are appreciated in the testicles. Left and right epididymis are normal. Rectal: Patient with  normal sphincter tone. Anus and perineum without scarring or rashes. No rectal masses are appreciated. Prostate is approximately 45 grams, no nodules are appreciated. Seminal vesicles could not be palpated.  Lymph: No inguinal adenopathy. Neurologic: Grossly intact, no focal deficits, moving all 4 extremities. Psychiatric: Normal mood and affect.   Laboratory Data: Component     Latest Ref Rng & Units 02/16/2016 08/11/2016 02/14/2017 09/09/2017  Prostate Specific Ag, Serum     0.0 - 4.0 ng/mL 0.4 0.3 0.4 0.4   Component     Latest Ref Rng & Units 09/11/2018 09/25/2018 09/18/2019 09/19/2020  Prostate Specific Ag, Serum     0.0 - 4.0 ng/mL 2.4 0.7 0.4 0.4   Lab Results  Component Value Date   TESTOSTERONE 462 09/19/2020    Lab Results  Component Value Date   AST 17 09/19/2020   Lab Results  Component Value Date   ALT 14 09/19/2020   Urinalysis Component     Latest Ref Rng & Units 09/25/2020  Specific Gravity, UA     1.005 - 1.030 1.025  pH, UA     5.0 - 7.5 5.0  Color, UA     Yellow Yellow  Appearance Ur     Clear Clear  Leukocytes,UA     Negative Negative  Protein,UA     Negative/Trace Negative  Glucose, UA     Negative Trace (A)  Ketones, UA     Negative Negative  RBC, UA     Negative Trace (A)  Bilirubin, UA     Negative Negative  Urobilinogen, Ur     0.2 - 1.0 mg/dL 0.2  Nitrite, UA     Negative Negative  Microscopic Examination      See below:   Component     Latest Ref Rng & Units 09/25/2020  WBC, UA     0 - 5 /hpf  None seen  RBC     0 - 2 /hpf None seen  Epithelial Cells (non renal)     0 - 10 /hpf 0-10  Casts     None seen /lpf Present (A)  Cast Type     N/A Hyaline casts  Mucus, UA     Not Estab. Present  Bacteria, UA     None seen/Few None seen   I have reviewed the labs  Assessment & Plan:    1. Testosterone deficiency - testosterone level is therapeutic - LFT's, HCT, hbg and PSA normal - Continue Clomid 50 mg, 1/2 tablet daily; refills given  2. BPH with LUTS - IPSS score is stable - Continue conservative management, avoiding bladder irritants and timed voiding's - Continue tamsulosin 0.4 mg daily-[facility]  3. Erectile dysfunction:    - SHIM score is improved - continue sildenafil - [facility]   Return in about 1 year (around 09/25/2021) for PSA, LFT's, HCT, HBG, testosterone (before 10 AM), IPSS, SHIM and exam.  Zara Council, PA-C  [facility] [address] [place], [place] [address] [phone]

## 2020-09-25 ENCOUNTER — Ambulatory Visit (INDEPENDENT_AMBULATORY_CARE_PROVIDER_SITE_OTHER): Admitting: Urology

## 2020-09-25 ENCOUNTER — Other Ambulatory Visit: Payer: Self-pay

## 2020-09-25 ENCOUNTER — Encounter: Payer: Self-pay | Admitting: Urology

## 2020-09-25 VITALS — BP 115/77 | HR 76 | Ht 70.0 in | Wt 206.0 lb

## 2020-09-25 DIAGNOSIS — N529 Male erectile dysfunction, unspecified: Secondary | ICD-10-CM

## 2020-09-25 DIAGNOSIS — E349 Endocrine disorder, unspecified: Secondary | ICD-10-CM

## 2020-09-25 DIAGNOSIS — N138 Other obstructive and reflux uropathy: Secondary | ICD-10-CM | POA: Diagnosis not present

## 2020-09-25 DIAGNOSIS — N401 Enlarged prostate with lower urinary tract symptoms: Secondary | ICD-10-CM | POA: Diagnosis not present

## 2020-09-25 LAB — MICROSCOPIC EXAMINATION
Bacteria, UA: NONE SEEN
RBC, Urine: NONE SEEN /hpf (ref 0–2)
WBC, UA: NONE SEEN /hpf (ref 0–5)

## 2020-09-25 LAB — URINALYSIS, COMPLETE
Bilirubin, UA: NEGATIVE
Ketones, UA: NEGATIVE
Leukocytes,UA: NEGATIVE
Nitrite, UA: NEGATIVE
Protein,UA: NEGATIVE
Specific Gravity, UA: 1.025 (ref 1.005–1.030)
Urobilinogen, Ur: 0.2 mg/dL (ref 0.2–1.0)
pH, UA: 5 (ref 5.0–7.5)

## 2020-09-25 MED ORDER — CLOMIPHENE CITRATE 50 MG PO TABS: 25.0000 mg | ORAL_TABLET | Freq: Every day | ORAL | 3 refills | Status: DC

## 2020-10-09 ENCOUNTER — Other Ambulatory Visit: Payer: Self-pay | Admitting: Urology

## 2021-07-05 ENCOUNTER — Other Ambulatory Visit: Payer: Self-pay

## 2021-07-05 ENCOUNTER — Ambulatory Visit
Admission: EM | Admit: 2021-07-05 | Discharge: 2021-07-05 | Disposition: A | Discharge status: Home / Self Care | Attending: Emergency Medicine | Admitting: Emergency Medicine

## 2021-07-05 DIAGNOSIS — U071 COVID-19: Secondary | ICD-10-CM | POA: Diagnosis not present

## 2021-07-05 DIAGNOSIS — R051 Acute cough: Secondary | ICD-10-CM | POA: Insufficient documentation

## 2021-07-05 LAB — RESP PANEL BY RT-PCR (FLU A&B, COVID) ARPGX2
Influenza A by PCR: NEGATIVE
Influenza B by PCR: NEGATIVE
SARS Coronavirus 2 by RT PCR: POSITIVE — AB

## 2021-07-05 MED ORDER — PROMETHAZINE-DM 6.25-15 MG/5ML PO SYRP: 5.0000 mL | ORAL_SOLUTION | Freq: Three times a day (TID) | ORAL | 0 refills | Status: DC | PRN

## 2021-07-05 NOTE — ED Provider Notes (Signed)
[facility]    CSN: [id number] Arrival date & time: 07/05/21  1050      History   Chief Complaint Chief Complaint  Patient presents with   "COVID19 Symptoms"    HPI Frank [name]. is a 60 y.o. male.   Patient presents today with a 3-day history of URI symptoms.  Reports subjective fever, fatigue, malaise, cough, chest tightness, nasal congestion, nausea, sore throat.  Denies any chest pain, shortness of breath, wheezing, vomiting, diarrhea.  Reports a viral illness has been going through his house but he is unsure what it is is nobody's been tested.  He is not taking any at home COVID test.  He has had COVID-vaccine but has not had booster.  He has had a flu shot.  Denies any significant past medical history including asthma, allergies, COPD.  He does not smoke.  He denies history of diabetes.  He has been taking over-the-counter medications including Mucinex and Tylenol with minimal improvement of symptoms.  Denies any recent antibiotic use.   Past Medical History:  Diagnosis Date   Abdominal pain, right upper quadrant 09/17/2013   Acid reflux    Anxiety and depression    Back pain 03/31/2015   BPH (benign prostatic hyperplasia)    BPH with obstruction/lower urinary tract symptoms 03/22/2015   Frequency    Hypogonadism in male    Hypothyroidism    Kidney cysts 09/17/2013   Kidney lump 09/17/2013   Over weight    Overweight (BMI 25.0-29.9) 05/15/2015   Preventative health care 03/31/2015   Sleep apnea    Testicular hypofunction    Weak urinary stream     Patient Active Problem List   Diagnosis Date Noted   Chronic renal insufficiency, stage II (mild) 04/14/2018   Closed fracture of distal end of radius 02/06/2018   Degenerative joint disease of left hip 01/04/2018   Left groin mass 07/13/2017   Frequent headaches 01/28/2016   Erectile dysfunction of organic origin 09/16/2015   Overweight (BMI 25.0-29.9) 05/15/2015   Preventative health care 03/31/2015    Hypothyroidism 03/31/2015   Back pain 03/31/2015   BPH with obstruction/lower urinary tract symptoms 03/22/2015   Hypogonadism in male 03/22/2015   Abdominal pain, right upper quadrant 09/17/2013   Kidney cysts 09/17/2013   Kidney lump 09/17/2013    Past Surgical History:  Procedure Laterality Date   COLONOSCOPY  2014   Dr Allen Norris       Home Medications    Prior to Admission medications   Medication Sig Start Date End Date Taking? Authorizing Provider  Cholecalciferol (VITAMIN D3) 1000 units CAPS Take by mouth.   Yes [provider]  clomiPHENE (CLOMID) 50 MG tablet Take 0.5 tablets (25 mg total) by mouth daily. 09/25/20  Yes McGowan, Larene Beach A, PA-C  cyclobenzaprine (FLEXERIL) 5 MG tablet Take 1-2 tablets 3 times daily as needed 04/15/20  Yes Laban Emperor, PA-C  diclofenac (VOLTAREN) 50 MG EC tablet  08/28/19  Yes [provider]  levothyroxine (SYNTHROID, LEVOTHROID) 125 MCG tablet Take 1 tablet (125 mcg total) by mouth daily before breakfast. 04/14/18  Yes Lada, Satira Anis, MD  naproxen sodium (ANAPROX) 220 MG tablet Take 220 mg by mouth 2 (two) times daily with a meal.   Yes [provider]  promethazine-dextromethorphan (PROMETHAZINE-DM) 6.25-15 MG/5ML syrup Take 5 mLs by mouth 3 (three) times daily as needed for cough. 07/05/21  Yes Donold Marotto K, PA-C  sertraline (ZOLOFT) 50 MG tablet Take 50 mg by  mouth daily.  02/15/14  Yes [provider]  sildenafil (REVATIO) 20 MG tablet Take 3 to 5 tablets two hours before intercouse on an empty stomach.  Do not take with nitrates. 09/13/18  Yes McGowan, Larene Beach A, PA-C  tamsulosin (FLOMAX) 0.4 MG CAPS capsule TAKE 1 CAPSULE BY MOUTH ONCE DAILY 10/09/20  Yes McGowan, Larene Beach A, PA-C  topiramate (TOPAMAX) 50 MG tablet  12/25/18   [provider]    Family History Family History  Problem Relation Age of Onset   Stroke Mother    Cancer Mother        breast   Migraines Mother    Breast cancer Mother     Heart disease Other        uncle   Congestive Heart Failure Maternal Grandmother    Cancer Paternal Grandmother        throat   Skin cancer Maternal Grandfather    Prostate cancer Paternal Uncle    Kidney disease Neg Hx    Kidney cancer Neg Hx    Bladder Cancer Neg Hx     Social History Social History   Tobacco Use   Smoking status: Never   Smokeless tobacco: Former    Quit date: 03/30/1985  Vaping Use   Vaping Use: Never used  Substance Use Topics   Alcohol use: No    Alcohol/week: 0.0 standard drinks   Drug use: No     Allergies   Patient has no known allergies.   Review of Systems Review of Systems  Constitutional:  Positive for activity change and fatigue. Negative for appetite change and fever.  HENT:  Positive for congestion and sore throat. Negative for sinus pressure and sneezing.   Respiratory:  Positive for cough and chest tightness. Negative for shortness of breath and wheezing.   Cardiovascular:  Negative for chest pain.  Gastrointestinal:  Positive for nausea. Negative for abdominal pain, diarrhea and vomiting.  Musculoskeletal:  Negative for arthralgias and myalgias.  Neurological:  Positive for headaches. Negative for dizziness and light-headedness.    Physical Exam Triage Vital Signs ED Triage Vitals  Enc Vitals Group     BP 07/05/21 1059 121/71     Pulse Rate 07/05/21 1059 76     Resp 07/05/21 1059 18     Temp 07/05/21 1059 98.6 F (37 C)     Temp Source 07/05/21 1059 Oral     SpO2 07/05/21 1059 97 %     Weight 07/05/21 1057 203 lb (92.1 kg)     Height --      Head Circumference --      Peak Flow --      Pain Score 07/05/21 1057 0     Pain Loc --      Pain Edu? --      Excl. in [place]? --    No data found.  Updated Vital Signs BP 121/71 (BP Location: Left Arm)    Pulse 76    Temp 98.6 F (37 C) (Oral)    Resp 18    Wt 203 lb (92.1 kg)    SpO2 97%    BMI 29.13 kg/m   Visual Acuity Right Eye Distance:   Left Eye Distance:    Bilateral Distance:    Right Eye Near:   Left Eye Near:    Bilateral Near:     Physical Exam Vitals reviewed.  Constitutional:      General: He is awake.     Appearance: Normal appearance. He  is well-developed. He is not ill-appearing.     Comments: Very pleasant male appears stated age in no acute distress sitting comfortably in exam room  HENT:     Head: Normocephalic and atraumatic.     Right Ear: Tympanic membrane, ear canal and external ear normal. Tympanic membrane is not erythematous or bulging.     Left Ear: Tympanic membrane, ear canal and external ear normal. Tympanic membrane is not erythematous or bulging.     Nose: Nose normal.     Mouth/Throat:     Pharynx: Uvula midline. Posterior oropharyngeal erythema present. No oropharyngeal exudate.  Cardiovascular:     Rate and Rhythm: Normal rate and regular rhythm.     Heart sounds: Normal heart sounds, S1 normal and S2 normal. No murmur heard. Pulmonary:     Effort: Pulmonary effort is normal. No accessory muscle usage or respiratory distress.     Breath sounds: Normal breath sounds. No stridor. No wheezing, rhonchi or rales.     Comments: Reactive cough with deep breathing Abdominal:     Palpations: Abdomen is soft.     Tenderness: There is no abdominal tenderness.  Neurological:     Mental Status: He is alert.  Psychiatric:        Behavior: Behavior is cooperative.     UC Treatments / Results  Labs (all labs ordered are listed, but only abnormal results are displayed) Labs Reviewed  RESP PANEL BY RT-PCR (FLU A&B, COVID) ARPGX2 - Abnormal; Notable for the following components:      Result Value   SARS Coronavirus 2 by RT PCR POSITIVE (*)    All other components within normal limits    EKG   Radiology No results found.  Procedures Procedures (including critical care time)  Medications Ordered in UC Medications - No data to display  Initial Impression / Assessment and Plan / UC Course  I have  reviewed the triage vital signs and the nursing notes.  Pertinent labs & imaging results that were available during my care of the patient were reviewed by me and considered in my medical decision making (see chart for details).      Patient has a positive for COVID-19.  He is within 5 days we discussed potential utility of antiviral medication such as Paxlovid but given patient is under 65 and otherwise healthy without significant risk factors through shared decision making he decided not to start these medications.  We will treat symptomatically and he was given Promethazine DM for cough.  Discussed that this can be sedating and he is not to drive or drink alcohol while taking it.  Recommended he use over-the-counter medications including Tylenol, ibuprofen, Mucinex, Flonase for symptom relief.  He is to rest and drink plenty of fluid.  He was given work excuse note with current CDC return to work guidelines.  Discussed alarm symptoms that warrant emergent evaluation including difficulty speaking, shortness of breath, chest discomfort, nausea/vomiting interfering with oral intake, weakness, high fever not responding to medication.  Strict return precautions given to which he expressed understanding.  Final Clinical Impressions(s) / UC Diagnoses   Final diagnoses:  COVID-19  Acute cough     Discharge Instructions      He tested positive for COVID-19.  Use promethazine DM for cough.  This can make you sleepy so do not drive or drink alcohol while taking it.  Alternate Tylenol and ibuprofen for fever and pain.  Use Mucinex and Flonase for congestion and cough.  Make  sure you rest and drink plenty of fluid.  You were provided a work excuse note today.  If you have any worsening symptoms particularly shortness of breath, difficulty speaking in full sentences, chest discomfort, nausea/vomiting interfering with oral intake, high fever you need to go to the emergency room.     ED Prescriptions      Medication Sig Dispense Auth. Provider   promethazine-dextromethorphan (PROMETHAZINE-DM) 6.25-15 MG/5ML syrup Take 5 mLs by mouth 3 (three) times daily as needed for cough. 118 mL Ellysa Parrack K, PA-C      PDMP not reviewed this encounter.   Terrilee Croak, PA-C 07/05/21 1249

## 2021-07-05 NOTE — Discharge Instructions (Signed)
He tested positive for COVID-19.  Use promethazine DM for cough.  This can make you sleepy so do not drive or drink alcohol while taking it.  Alternate Tylenol and ibuprofen for fever and pain.  Use Mucinex and Flonase for congestion and cough.  Make sure you rest and drink plenty of fluid.  You were provided a work excuse note today.  If you have any worsening symptoms particularly shortness of breath, difficulty speaking in full sentences, chest discomfort, nausea/vomiting interfering with oral intake, high fever you need to go to the emergency room.

## 2021-07-05 NOTE — ED Triage Notes (Signed)
Patient is here today for "Covid19 symptoms". Started Friday morning with "Cough, h/a, s/t, chills, burning in chest after cough, no fever known, fatigue". No sob. No wheezing. No new/unexplained rash. COVID19 vaccines done. Flu vaccine not done. No @ home COVID19 testing.

## 2021-07-23 ENCOUNTER — Encounter: Payer: Self-pay | Admitting: Dermatology

## 2021-07-30 ENCOUNTER — Ambulatory Visit: Admitting: Dermatology

## 2021-09-18 ENCOUNTER — Other Ambulatory Visit: Payer: Self-pay

## 2021-09-18 ENCOUNTER — Encounter: Payer: Self-pay | Admitting: Urology

## 2021-09-18 DIAGNOSIS — N138 Other obstructive and reflux uropathy: Secondary | ICD-10-CM

## 2021-09-18 DIAGNOSIS — E349 Endocrine disorder, unspecified: Secondary | ICD-10-CM

## 2021-09-21 NOTE — Progress Notes (Signed)
error 

## 2021-09-22 ENCOUNTER — Encounter: Payer: Self-pay | Admitting: Urology

## 2021-09-22 ENCOUNTER — Other Ambulatory Visit: Payer: Self-pay

## 2021-09-22 ENCOUNTER — Ambulatory Visit (INDEPENDENT_AMBULATORY_CARE_PROVIDER_SITE_OTHER): Admitting: Urology

## 2021-09-22 VITALS — BP 106/67 | HR 64 | Ht 70.0 in | Wt 199.0 lb

## 2021-09-22 DIAGNOSIS — E349 Endocrine disorder, unspecified: Secondary | ICD-10-CM

## 2021-09-22 DIAGNOSIS — N138 Other obstructive and reflux uropathy: Secondary | ICD-10-CM | POA: Diagnosis not present

## 2021-09-22 DIAGNOSIS — N529 Male erectile dysfunction, unspecified: Secondary | ICD-10-CM | POA: Diagnosis not present

## 2021-09-22 DIAGNOSIS — N401 Enlarged prostate with lower urinary tract symptoms: Secondary | ICD-10-CM

## 2021-09-22 MED ORDER — CLOMIPHENE CITRATE 50 MG PO TABS: 25.0000 mg | ORAL_TABLET | Freq: Every day | ORAL | 3 refills | Status: DC

## 2021-09-22 NOTE — Progress Notes (Signed)
09/13/2018 9:19 AM   Frank [name]. 07-08-1961 [id number]  Referring provider: No referring provider defined for this encounter.  Chief Complaint  Patient presents with   Benign Prostatic Hypertrophy   Erectile Dysfunction   Testosterone deficiency   Urological history: 1. Testosterone deficiency -contributing factors of age and obesity - testosterone level pending -H & H WNL 08/2021 -LFT's 08/2021 -managed with Clomid 50 mg, 1/2 daily  2. BPH with LU TS -PSA 0.53 in 08/2021 -I PSS 1/1 -managed with tamsulosin 0.4 mg daily  3. ED -contributory factors of age, BPH, testosterone deficiency, hypothyroidism, sleep apnea, and depression -SHIM 22 - managed with sildenafil 20 mg, 3 to 5 tablets as needed for ED  4. Family history of prostate cancer -paternal uncle having been diagnosed in his 41s; he passed away at 67    HPI: Frank [name]. is a 61 y.o. male with who presents today for a one year follow-up  He has no urinary complaints at this time.  Patient denies any modifying or aggravating factors.  Patient denies any gross hematuria, dysuria or suprapubic/flank pain.  Patient denies any fevers, chills, nausea or vomiting.     IPSS     Row Name 09/22/21 0800         International Prostate Symptom Score   How often have you had the sensation of not emptying your bladder? Not at All     How often have you had to urinate less than every two hours? Not at All     How often have you found you stopped and started again several times when you urinated? Not at All     How often have you found it difficult to postpone urination? Not at All     How often have you had a weak urinary stream? Less than 1 in 5 times     How often have you had to strain to start urination? Not at All     How many times did you typically get up at night to urinate? None     Total IPSS Score 1       Quality of Life due to urinary symptoms   If you were to spend the rest of your life  with your urinary condition just the way it is now how would you feel about that? Pleased              Score:  1-7 Mild 8-19 Moderate 20-35 Severe  Patient is not having spontaneous erections.  He denies any pain or curvature with erections.  At goal with sildenafil on demand dosing.   [place] Name 09/22/21 0856         SHIM: Over the last 6 months:   How do you rate your confidence that you could get and keep an erection? Moderate     When you had erections with sexual stimulation, how often were your erections hard enough for penetration (entering your partner)? Most Times (much more than half the time)     During sexual intercourse, how often were you able to maintain your erection after you had penetrated (entered) your partner? Almost Always or Always     During sexual intercourse, how difficult was it to maintain your erection to completion of intercourse? Not Difficult     When you attempted sexual intercourse, how often was it satisfactory for you? Almost Always or Always       SHIM Total Score  SHIM 22              Score: 1-7 Severe ED 8-11 Moderate ED 12-16 Mild-Moderate ED 17-21 Mild ED 22-25 No ED   PMH: Past Medical History:  Diagnosis Date   Abdominal pain, right upper quadrant 09/17/2013   Acid reflux    Anxiety and depression    Back pain 03/31/2015   BPH (benign prostatic hyperplasia)    BPH with obstruction/lower urinary tract symptoms 03/22/2015   Dysplastic nevus 06/27/2008   Left eart ant. helix. Slight atypia.   Frequency    Hypogonadism in male    Hypothyroidism    Kidney cysts 09/17/2013   Kidney lump 09/17/2013   Over weight    Overweight (BMI 25.0-29.9) 05/15/2015   Preventative health care 03/31/2015   Sleep apnea    Testicular hypofunction    Weak urinary stream     Surgical History: Past Surgical History:  Procedure Laterality Date   COLONOSCOPY  2014   Dr Allen Norris    Home Medications:  Allergies as of  09/22/2021   No Known Allergies      Medication List        Accurate as of September 22, 2021  9:19 AM. If you have any questions, ask your nurse or doctor.          clomiPHENE 50 MG tablet Commonly known as: CLOMID Take 0.5 tablets (25 mg total) by mouth daily.   cyclobenzaprine 5 MG tablet Commonly known as: FLEXERIL Take 1-2 tablets 3 times daily as needed   diclofenac 50 MG EC tablet Commonly known as: VOLTAREN   levothyroxine 125 MCG tablet Commonly known as: SYNTHROID Take 1 tablet (125 mcg total) by mouth daily before breakfast.   naproxen sodium 220 MG tablet Commonly known as: ALEVE Take 220 mg by mouth 2 (two) times daily with a meal.   promethazine-dextromethorphan 6.25-15 MG/5ML syrup Commonly known as: PROMETHAZINE-DM Take 5 mLs by mouth 3 (three) times daily as needed for cough.   sertraline 50 MG tablet Commonly known as: ZOLOFT Take 50 mg by mouth daily.   sildenafil 20 MG tablet Commonly known as: REVATIO Take 3 to 5 tablets two hours before intercouse on an empty stomach.  Do not take with nitrates.   tamsulosin 0.4 MG Caps capsule Commonly known as: FLOMAX TAKE 1 CAPSULE BY MOUTH ONCE DAILY   topiramate 50 MG tablet Commonly known as: TOPAMAX   Vitamin D3 25 MCG (1000 UT) Caps Take by mouth.        Allergies: No Known Allergies  Family History: Family History  Problem Relation Age of Onset   Stroke Mother    Cancer Mother        breast   Migraines Mother    Breast cancer Mother    Heart disease Other        uncle   Congestive Heart Failure Maternal Grandmother    Cancer Paternal Grandmother        throat   Skin cancer Maternal Grandfather    Prostate cancer Paternal Uncle    Kidney disease Neg Hx    Kidney cancer Neg Hx    Bladder Cancer Neg Hx     Social History:  reports that he has never smoked. He quit smokeless tobacco use about 36 years ago. He reports that he does not drink alcohol and does not use  drugs.  ROS: For pertinent review of systems please refer to history of present illness  Physical Exam: BP 106/67  Pulse 64    Ht '5\' 10"'  (1.778 m)    Wt 199 lb (90.3 kg)    BMI 28.55 kg/m   Constitutional:  Well nourished. Alert and oriented, No acute distress. HEENT: [place] AT, mask in place.  Trachea midline Cardiovascular: No clubbing, cyanosis, or edema. Respiratory: Normal respiratory effort, no increased work of breathing. GU: No CVA tenderness.  No bladder fullness or masses.  Patient with circumcised phallus.  Urethral meatus is patent.  No penile discharge. No penile lesions or rashes. Scrotum without lesions, cysts, rashes and/or edema.  Testicles are located scrotally bilaterally. No masses are appreciated in the testicles. Left and right epididymis are normal. Rectal: Patient with  normal sphincter tone. Anus and perineum without scarring or rashes. No rectal masses are appreciated. Prostate is approximately 45 grams, no nodules are appreciated. Seminal vesicles could not be palpated Neurologic: Grossly intact, no focal deficits, moving all 4 extremities. Psychiatric: Normal mood and affect.   Laboratory Data: Date/Time Source Result Type Result - Unit Interpretation Reference Range Comment  Sep 08, 2021 08:34 AM [facility] PSA Specimen Type: SERUM Comment: Interpretive Ranges for eGFR (CKD-EPI 2021): eGFR >90 = Good kidney function eGFR 60 - 89 = Normal to Mild decrease, based on age and sex eGFR >60 = Normal eGFR 30 - 59 Moderately decreased eGFR 15 - 29 Severely decreased eGFR < 15 Kidney failure Ordering Provider: Sylvan Cheese Report Released Date/Time: Sep 08, 2021 08:07 AM Reporting Lab: [facility] [place] [address] Performing Lab: [facility] [address] [place] [address]      PSA 0.53    0-4    Sep 08, 2021 08:34 AM [place] TSH WITH T4 Specimen Type: SERUM Comment: Interpretive  Ranges for eGFR (CKD-EPI 2021): eGFR >90 = Good kidney function eGFR 60 - 89 = Normal to Mild decrease, based on age and sex eGFR >60 = Normal eGFR 30 - 59 Moderately decreased eGFR 15 - 29 Severely decreased eGFR < 15 Kidney failure Ordering Provider: Sylvan Cheese Report Released Date/Time: Sep 08, 2021 08:07 AM Reporting Lab: [facility] [place] [address] Performing Lab: [facility] [address] [place] [address]      TSH 0.362    0.34-5.60        FREE T4 0.92    0.75-1.46    Sep 08, 2021 08:34 AM [place] GI PANEL(AST,ALT,ALKP,T.BILI) Specimen Type: SERUM Comment: Interpretive Ranges for eGFR (CKD-EPI 2021): eGFR >90 = Good kidney function eGFR 60 - 89 = Normal to Mild decrease, based on age and sex eGFR >60 = Normal eGFR 30 - 59 Moderately decreased eGFR 15 - 29 Severely decreased eGFR < 15 Kidney failure Ordering Provider: Sylvan Cheese Report Released Date/Time: Sep 08, 2021 08:07 AM Reporting Lab: [facility] [place] [address] Performing Lab: [facility] [address] [place] [address]      TOT. BILIRUBIN 0.6    0.4-1.6        ALKALINE PHOSPHATASE 70    35-120        AST 16    10-40        ALT 25    10-60    Sep 08, 2021 08:34 AM [facility] LIPID PANEL NON-FASTING Specimen Type: SERUM Comment: Interpretive Ranges for eGFR (CKD-EPI 2021): eGFR >90 = Good kidney function  eGFR 60 - 89 = Normal to Mild decrease, based on age and sex eGFR >60 = Normal eGFR 30 - 59 Moderately decreased eGFR 15 - 29 Severely decreased eGFR < 15 Kidney failure Ordering Provider: Sylvan Cheese Report Released Date/Time: Sep 08, 2021 08:07 AM Reporting Lab: [facility] [place] [address] Performing Lab: [facility] [address] [place] [address]      CHOLESTEROL, TOTAL 138    100-199        HDL CHOLESTEROL 49     29-71        LDL CHOLESTEROL 74    0-99        TRIGLYCERIDE 61    35-200    Sep 08, 2021 08:34 AM [place] CHEM7/CA++ Specimen Type: SERUM Comment: Interpretive Ranges for eGFR (CKD-EPI 2021): eGFR >90 = Good kidney function eGFR 60 - 89 = Normal to Mild decrease, based on age and sex eGFR >60 = Normal eGFR 30 - 59 Moderately decreased eGFR 15 - 29 Severely decreased eGFR < 15 Kidney failure Ordering Provider: Sylvan Cheese Report Released Date/Time: Sep 08, 2021 08:07 AM Reporting Lab: [facility] [address] [place] [address] Performing Lab: [place] [address]      CREATININE 1.3    0.6-1.4        UREA NITROGEN 15    8-23        GLUCOSE 116  H 70-110        SODIUM 141    134-144        POTASSIUM 3.9    3.5-5.4        CHLORIDE 107    100-110        CO2 27    22-31        CALCIUM 8.9    8.5-10.1        GFR ESTIMATION (CKD-EPI) 64    >=90    Sep 08, 2021 08:34 AM [facility] CBC PANEL Specimen Type: BLOOD No comment entered. Ordering Provider: Sylvan Cheese Report Released Date/Time: Sep 08, 2021 08:07 AM Reporting Lab: [facility] [address] [place] [address] Performing Lab: [facility] [address] [place] [address]      WBC 7.39    3.7-9.6        RBC 4.88    4.2-5.4        HGB 14.7    12-17        HCT 44.9    36-51        MCV 92.0    77-101        MCH 30.1    26-36        MCHC 32.7  L 33-36        PLT 225    123-309        MPV 9.8    7.0-11.0        RDW 11.8    11.0-15.0        MONO % 7.6    6.0-15        NEUTRO % 52.1    40.0-75        LYMPH ABSOLUTE CT. 2.67    0.9-3.3        MONO ABSOLUTE CT. 0.56    .30-1.1        NEUTRO ABSOLUTE CT. 3.85    1.5-7.1  EOS % 3.7    0-7.0        BASO % 0.4    0-2.0        EOS ABSOLUTE CT. 0.27    0-.7        BASO ABSOLUTE CT. 0.03    0-.2        LYMPH % 36.1    15-45        MORPHOLOGY  REVIEW(WAM) N             ABSOLUTE IMMATURE GRANS 0.01    0.01-0.03        % IMMATURE GRANS (WAM) 0.1    0.1-0.3        %NRBC (WAM) 0.0    0.0-0.2        DIFFERENTIAL TYPE AUTO             ABSOLUTE NRBC 0.00    0-1      Pertinent imaging   Assessment & Plan:    1. Testosterone deficiency -testosterone level pending -LFT, PSA and CBC WNL -Continue Clomid 50 mg, 1/2 tablet daily; refills given  2. BPH with LUTS -PSA stable -DRE benign -continue conservative management, avoiding bladder irritants and timed voiding's -Continue tamsulosin 0.4 mg daily-[facility]   3. Erectile dysfunction:    -continue sildenafil - [facility]   Return for IPSS, SHIM and exam.  Zara Council, PA-C  [facility] [address], [place], [place] [address] [phone]

## 2021-09-23 LAB — TESTOSTERONE: Testosterone: 568 ng/dL (ref 264–916)

## 2021-11-16 HISTORY — PX: SPINAL FUSION: SHX223

## 2022-02-02 ENCOUNTER — Other Ambulatory Visit: Payer: Self-pay | Admitting: Urology

## 2022-02-02 DIAGNOSIS — E349 Endocrine disorder, unspecified: Secondary | ICD-10-CM

## 2022-02-02 MED ORDER — CLOMIPHENE CITRATE 50 MG PO TABS
25.0000 mg | ORAL_TABLET | Freq: Every day | ORAL | 3 refills | Status: AC
Start: 2022-02-02 — End: ?

## 2022-04-02 ENCOUNTER — Ambulatory Visit
Admission: EM | Admit: 2022-04-02 | Discharge: 2022-04-02 | Disposition: A | Discharge status: Home / Self Care | Attending: Physician Assistant | Admitting: Physician Assistant

## 2022-04-02 DIAGNOSIS — H60391 Other infective otitis externa, right ear: Secondary | ICD-10-CM

## 2022-04-02 DIAGNOSIS — J029 Acute pharyngitis, unspecified: Secondary | ICD-10-CM

## 2022-04-02 MED ORDER — OFLOXACIN 0.3 % OT SOLN: 10.0000 [drp] | Freq: Every day | OTIC | 0 refills | Status: AC

## 2022-04-02 NOTE — ED Triage Notes (Signed)
Pt c/o possible ear infection. Pt has bilateral ear pain starting around lunch time today.  Pt states that they are itchy and the right ear has pressure and pain. Pt states that his throat is sore but not as painful as his right ear.

## 2022-04-02 NOTE — ED Provider Notes (Signed)
[facility]    CSN: [id number] Arrival date & time: 04/02/22  1739      History   Chief Complaint Chief Complaint  Patient presents with   Ear Problem    HPI Frank [name]. is a 61 y.o. male presenting for right ear pain and itchiness/pressure.  Reports some pressure of the left ear, mild congestion and scratchy throat.  Patient says pain of the right ear is 6 out of 10 pain in his throat is a 1 out of 10.  He denies fever, cough.  He has not had any drainage from the ear or hearing issue.  He has not treated condition in any way as it just started a few hours ago.  HPI  Past Medical History:  Diagnosis Date   Abdominal pain, right upper quadrant 09/17/2013   Acid reflux    Anxiety and depression    Back pain 03/31/2015   BPH (benign prostatic hyperplasia)    BPH with obstruction/lower urinary tract symptoms 03/22/2015   Dysplastic nevus 06/27/2008   Left eart ant. helix. Slight atypia.   Frequency    Hypogonadism in male    Hypothyroidism    Kidney cysts 09/17/2013   Kidney lump 09/17/2013   Over weight    Overweight (BMI 25.0-29.9) 05/15/2015   Preventative health care 03/31/2015   Sleep apnea    Testicular hypofunction    Weak urinary stream     Patient Active Problem List   Diagnosis Date Noted   Chronic renal insufficiency, stage II (mild) 04/14/2018   Closed fracture of distal end of radius 02/06/2018   Degenerative joint disease of left hip 01/04/2018   Left groin mass 07/13/2017   Frequent headaches 01/28/2016   Erectile dysfunction of organic origin 09/16/2015   Overweight (BMI 25.0-29.9) 05/15/2015   Preventative health care 03/31/2015   Hypothyroidism 03/31/2015   Back pain 03/31/2015   BPH with obstruction/lower urinary tract symptoms 03/22/2015   Hypogonadism in male 03/22/2015   Abdominal pain, right upper quadrant 09/17/2013   Kidney cysts 09/17/2013   Kidney lump 09/17/2013    Past Surgical History:  Procedure Laterality  Date   COLONOSCOPY  2014   Dr Allen Norris   SPINAL FUSION  11/2021   c6 and c7 fusion       Home Medications    Prior to Admission medications   Medication Sig Start Date End Date Taking? Authorizing Provider  Cholecalciferol (VITAMIN D3) 1000 units CAPS Take by mouth.   Yes [provider]  clomiPHENE (CLOMID) 50 MG tablet Take 0.5 tablets (25 mg total) by mouth daily. 02/02/22  Yes McGowan, Larene Beach A, PA-C  cyclobenzaprine (FLEXERIL) 5 MG tablet Take 1-2 tablets 3 times daily as needed 04/15/20  Yes Laban Emperor, PA-C  diclofenac (VOLTAREN) 50 MG EC tablet  08/28/19  Yes [provider]  levothyroxine (SYNTHROID, LEVOTHROID) 125 MCG tablet Take 1 tablet (125 mcg total) by mouth daily before breakfast. 04/14/18  Yes Lada, Satira Anis, MD  naproxen sodium (ANAPROX) 220 MG tablet Take 220 mg by mouth 2 (two) times daily with a meal.   Yes [provider]  ofloxacin (FLOXIN) 0.3 % OTIC solution Place 10 drops into the right ear daily for 7 days. 04/02/22 04/09/22 Yes Laurene Footman B, PA-C  sertraline (ZOLOFT) 50 MG tablet Take 50 mg by mouth daily.  02/15/14  Yes [provider]  sildenafil (REVATIO) 20 MG tablet Take 3 to 5 tablets two hours before intercouse on an empty  stomach.  Do not take with nitrates. 09/13/18  Yes McGowan, Larene Beach A, PA-C  tamsulosin (FLOMAX) 0.4 MG CAPS capsule TAKE 1 CAPSULE BY MOUTH ONCE DAILY 10/09/20  Yes McGowan, Larene Beach A, PA-C  promethazine-dextromethorphan (PROMETHAZINE-DM) 6.25-15 MG/5ML syrup Take 5 mLs by mouth 3 (three) times daily as needed for cough. Patient not taking: Reported on 09/22/2021 07/05/21   Raspet, Derry Skill, PA-C  topiramate (TOPAMAX) 50 MG tablet  12/25/18   [provider]    Family History Family History  Problem Relation Age of Onset   Stroke Mother    Cancer Mother        breast   Migraines Mother    Breast cancer Mother    Heart disease Other        uncle   Congestive Heart Failure Maternal  Grandmother    Cancer Paternal Grandmother        throat   Skin cancer Maternal Grandfather    Prostate cancer Paternal Uncle    Kidney disease Neg Hx    Kidney cancer Neg Hx    Bladder Cancer Neg Hx     Social History Social History   Tobacco Use   Smoking status: Never   Smokeless tobacco: Former    Quit date: 03/30/1985  Vaping Use   Vaping Use: Never used  Substance Use Topics   Alcohol use: No    Alcohol/week: 0.0 standard drinks of alcohol   Drug use: No     Allergies   Patient has no known allergies.   Review of Systems Review of Systems  Constitutional:  Negative for fatigue and fever.  HENT:  Positive for ear pain and sore throat. Negative for congestion, ear discharge, hearing loss, rhinorrhea, sinus pressure and sinus pain.   Respiratory:  Negative for cough.   Gastrointestinal:  Negative for nausea and vomiting.  Musculoskeletal:  Negative for myalgias.  Neurological:  Negative for weakness, light-headedness and headaches.  Hematological:  Negative for adenopathy.     Physical Exam Triage Vital Signs ED Triage Vitals  Enc Vitals Group     BP 04/02/22 1759 (!) 112/55     Pulse Rate 04/02/22 1759 73     Resp 04/02/22 1759 18     Temp 04/02/22 1759 98.1 F (36.7 C)     Temp Source 04/02/22 1759 Oral     SpO2 04/02/22 1759 97 %     Weight 04/02/22 1757 197 lb (89.4 kg)     Height 04/02/22 1757 '5\' 10"'$  (1.778 m)     Head Circumference --      Peak Flow --      Pain Score 04/02/22 1757 6     Pain Loc --      Pain Edu? --      Excl. in [place]? --    No data found.  Updated Vital Signs BP (!) 112/55 (BP Location: Left Arm)   Pulse 73   Temp 98.1 F (36.7 C) (Oral)   Resp 18   Ht '5\' 10"'$  (1.778 m)   Wt 197 lb (89.4 kg)   SpO2 97%   BMI 28.27 kg/m     Physical Exam Vitals and nursing note reviewed.  Constitutional:      General: He is not in acute distress.    Appearance: Normal appearance. He is well-developed. He is not ill-appearing.   HENT:     Head: Normocephalic and atraumatic.     Right Ear: Tympanic membrane, ear canal and external ear normal. Drainage (  thick yellowish debris adhered to [facility]), swelling (mild, EAC) and tenderness (tragus, mild) present.     Left Ear: Tympanic membrane, ear canal and external ear normal.     Nose: Nose normal.     Mouth/Throat:     Mouth: Mucous membranes are moist.     Pharynx: Oropharynx is clear. Posterior oropharyngeal erythema present.  Eyes:     General: No scleral icterus.    Conjunctiva/sclera: Conjunctivae normal.  Cardiovascular:     Rate and Rhythm: Normal rate and regular rhythm.  Pulmonary:     Effort: Pulmonary effort is normal. No respiratory distress.     Breath sounds: Normal breath sounds.  Musculoskeletal:     Cervical back: Neck supple.  Skin:    General: Skin is warm and dry.     Capillary Refill: Capillary refill takes less than 2 seconds.  Neurological:     General: No focal deficit present.     Mental Status: He is alert. Mental status is at baseline.     Motor: No weakness.     Gait: Gait normal.  Psychiatric:        Mood and Affect: Mood normal.        Behavior: Behavior normal.      UC Treatments / Results  Labs (all labs ordered are listed, but only abnormal results are displayed) Labs Reviewed - No data to display  EKG   Radiology No results found.  Procedures Procedures (including critical care time)  Medications Ordered in UC Medications - No data to display  Initial Impression / Assessment and Plan / UC Course  I have reviewed the triage vital signs and the nursing notes.  Pertinent labs & imaging results that were available during my care of the patient were reviewed by me and considered in my medical decision making (see chart for details).   61 year old male presenting for right ear pain, left ear pressure and scratchy throat.  All symptoms began this afternoon.  Examination today reveals mild swelling of the right EAC  with thick yellowish debris adherent to the walls of the EAC and tenderness to the tragus.  Mild edema posterior pharynx.  No evidence of ear infection on the left side.  Chest clear to auscultation heart regular rate and rhythm.  Suspect otitis externa of the right ear.  We will treat at this time with ofloxacin drops.  Advised treating the congestion, left ear pressure and scratchy throat with decongestant.  Could be due to allergies versus viral illness.  Supportive care advised.  Follow-up here as needed especially if symptoms are worsening or not improving over the next few days.   Final Clinical Impressions(s) / UC Diagnoses   Final diagnoses:  Infective otitis externa of right ear  Sore throat   Discharge Instructions   None    ED Prescriptions     Medication Sig Dispense Auth. Provider   ofloxacin (FLOXIN) 0.3 % OTIC solution Place 10 drops into the right ear daily for 7 days. 3.5 mL Danton Clap, PA-C      PDMP not reviewed this encounter.   Danton Clap, PA-C 04/02/22 1814

## 2022-06-30 ENCOUNTER — Ambulatory Visit
Admission: EM | Admit: 2022-06-30 | Discharge: 2022-06-30 | Disposition: A | Discharge status: Home / Self Care | Attending: Urgent Care | Admitting: Urgent Care

## 2022-06-30 DIAGNOSIS — J22 Unspecified acute lower respiratory infection: Secondary | ICD-10-CM

## 2022-06-30 MED ORDER — AZITHROMYCIN 250 MG PO TABS: ORAL_TABLET | ORAL | 0 refills | Status: DC

## 2022-06-30 NOTE — Discharge Instructions (Signed)
Follow up here or with your primary care provider if your symptoms are worsening or not improving with treatment.     

## 2022-06-30 NOTE — ED Provider Notes (Signed)
Frank [name]    CSN: [id number] Arrival date & time: 06/30/22  7322      History   Chief Complaint Chief Complaint  Patient presents with   Cough    HPI Frank [name]. is a 61 y.o. male.    Cough   Presents to urgent care with productive cough, chest congestion, wheezing x 1 week.  He endorses wheezing when laying down or when sitting up.  Symptoms are not relieved by coughing.  He endorses cough is severe.  Denies fever, chills, myalgias.  Past Medical History:  Diagnosis Date   Abdominal pain, right upper quadrant 09/17/2013   Acid reflux    Anxiety and depression    Back pain 03/31/2015   BPH (benign prostatic hyperplasia)    BPH with obstruction/lower urinary tract symptoms 03/22/2015   Dysplastic nevus 06/27/2008   Left eart ant. helix. Slight atypia.   Frequency    Hypogonadism in male    Hypothyroidism    Kidney cysts 09/17/2013   Kidney lump 09/17/2013   Over weight    Overweight (BMI 25.0-29.9) 05/15/2015   Preventative health care 03/31/2015   Sleep apnea    Testicular hypofunction    Weak urinary stream     Patient Active Problem List   Diagnosis Date Noted   Chronic renal insufficiency, stage II (mild) 04/14/2018   Closed fracture of distal end of radius 02/06/2018   Degenerative joint disease of left hip 01/04/2018   Left groin mass 07/13/2017   Frequent headaches 01/28/2016   Erectile dysfunction of organic origin 09/16/2015   Overweight (BMI 25.0-29.9) 05/15/2015   Preventative health care 03/31/2015   Hypothyroidism 03/31/2015   Back pain 03/31/2015   BPH with obstruction/lower urinary tract symptoms 03/22/2015   Hypogonadism in male 03/22/2015   Abdominal pain, right upper quadrant 09/17/2013   Kidney cysts 09/17/2013   Kidney lump 09/17/2013    Past Surgical History:  Procedure Laterality Date   COLONOSCOPY  2014   Dr Allen Norris   SPINAL FUSION  11/2021   c6 and c7 fusion       Home Medications    Prior to  Admission medications   Medication Sig Start Date End Date Taking? Authorizing Provider  Cholecalciferol (VITAMIN D3) 1000 units CAPS Take by mouth.    [provider]  clomiPHENE (CLOMID) 50 MG tablet Take 0.5 tablets (25 mg total) by mouth daily. 02/02/22   Zara Council A, PA-C  cyclobenzaprine (FLEXERIL) 5 MG tablet Take 1-2 tablets 3 times daily as needed 04/15/20   Laban Emperor, PA-C  diclofenac (VOLTAREN) 50 MG EC tablet  08/28/19   [provider]  levothyroxine (SYNTHROID, LEVOTHROID) 125 MCG tablet Take 1 tablet (125 mcg total) by mouth daily before breakfast. 04/14/18   Lada, Satira Anis, MD  naproxen sodium (ANAPROX) 220 MG tablet Take 220 mg by mouth 2 (two) times daily with a meal.    [provider]  promethazine-dextromethorphan (PROMETHAZINE-DM) 6.25-15 MG/5ML syrup Take 5 mLs by mouth 3 (three) times daily as needed for cough. Patient not taking: Reported on 09/22/2021 07/05/21   Raspet, Junie Panning K, PA-C  sertraline (ZOLOFT) 50 MG tablet Take 50 mg by mouth daily.  02/15/14   [provider]  sildenafil (REVATIO) 20 MG tablet Take 3 to 5 tablets two hours before intercouse on an empty stomach.  Do not take with nitrates. 09/13/18   Zara Council A, PA-C  tamsulosin (FLOMAX) 0.4 MG CAPS capsule TAKE 1 CAPSULE BY MOUTH  ONCE DAILY 10/09/20   Zara Council A, PA-C  topiramate (TOPAMAX) 50 MG tablet  12/25/18   [provider]    Family History Family History  Problem Relation Age of Onset   Stroke Mother    Cancer Mother        breast   Migraines Mother    Breast cancer Mother    Heart disease Other        uncle   Congestive Heart Failure Maternal Grandmother    Cancer Paternal Grandmother        throat   Skin cancer Maternal Grandfather    Prostate cancer Paternal Uncle    Kidney disease Neg Hx    Kidney cancer Neg Hx    Bladder Cancer Neg Hx     Social History Social History   Tobacco Use   Smoking status: Never    Smokeless tobacco: Former    Quit date: 03/30/1985  Vaping Use   Vaping Use: Never used  Substance Use Topics   Alcohol use: No    Alcohol/week: 0.0 standard drinks of alcohol   Drug use: No     Allergies   Patient has no known allergies.   Review of Systems Review of Systems  Respiratory:  Positive for cough.      Physical Exam Triage Vital Signs ED Triage Vitals  Enc Vitals Group     BP 06/30/22 1012 103/69     Pulse Rate 06/30/22 1012 91     Resp 06/30/22 1012 16     Temp 06/30/22 1012 97.7 F (36.5 C)     Temp src --      SpO2 06/30/22 1012 96 %     Weight --      Height --      Head Circumference --      Peak Flow --      Pain Score 06/30/22 1013 0     Pain Loc --      Pain Edu? --      Excl. in [place]? --    No data found.  Updated Vital Signs BP 103/69   Pulse 91   Temp 97.7 F (36.5 C)   Resp 16   SpO2 96%   Visual Acuity Right Eye Distance:   Left Eye Distance:   Bilateral Distance:    Right Eye Near:   Left Eye Near:    Bilateral Near:     Physical Exam Vitals reviewed.  Constitutional:      Appearance: Normal appearance.  Cardiovascular:     Rate and Rhythm: Normal rate and regular rhythm.     Pulses: Normal pulses.     Heart sounds: Normal heart sounds.  Pulmonary:     Effort: Pulmonary effort is normal.     Breath sounds: Wheezing present.  Skin:    General: Skin is warm and dry.  Neurological:     General: No focal deficit present.     Mental Status: He is alert and oriented to person, place, and time.  Psychiatric:        Mood and Affect: Mood normal.        Behavior: Behavior normal.      UC Treatments / Results  Labs (all labs ordered are listed, but only abnormal results are displayed) Labs Reviewed - No data to display  EKG   Radiology No results found.  Procedures Procedures (including critical care time)  Medications Ordered in UC Medications - No data to display  Initial  Impression / Assessment and  Plan / UC Course  I have reviewed the triage vital signs and the nursing notes.  Pertinent labs & imaging results that were available during my care of the patient were reviewed by me and considered in my medical decision making (see chart for details).   Patient is afebrile here without recent antipyretics. Satting well on room air. Overall is well appearing, well hydrated, without respiratory distress. Pulmonary exam is remarkable for wheezes present on exam.  Suspect viral process though concern for development of secondary bacterial.  Will treat with azithromycin.  Discussed use of prednisone with patient but he endorses history of a retinal disorder that is worsened by steroid use.  Agreed to forego for now.   Final Clinical Impressions(s) / UC Diagnoses   Final diagnoses:  None   Discharge Instructions   None    ED Prescriptions   None    PDMP not reviewed this encounter.   Rose Phi, [place] 06/30/22 [phone]

## 2022-06-30 NOTE — ED Triage Notes (Signed)
Pt. Presents to UC w/ a productive cough, chest congestion and wheezing for almost a week.

## 2022-09-22 NOTE — Progress Notes (Unsigned)
09/13/2018 9:25 AM   Frank [name]. [birth year] [id number]  Referring provider: No referring provider defined for this encounter.  Chief Complaint  Patient presents with   Other   Urological history: 1. Testosterone deficiency -contributing factors of age and obesity -testosterone level pending -Hemoglobin/hematocrit (09/2022) 14.4/44.2 -Hepatic panel (09/2022) normal  -Clomid 50 mg, 1/2 daily  2. BPH with LU TS -PSA (09/2022) 0.61 -I PSS 1/1 -tamsulosin 0.4 mg daily  3. ED -contributory factors of age, BPH, testosterone deficiency, hypothyroidism, sleep apnea, and depression -SHIM 22 -sildenafil 20 mg, 3 to 5 tablets as needed for ED  4. Family history of prostate cancer -paternal uncle having been diagnosed in his 12s; he passed away at 15    HPI: Frank [name]. is a 62 y.o. male with who presents today for a one year follow-up  I PSS 1/1  He has no complaints at this time.  Patient denies any modifying or aggravating factors.  Patient denies any gross hematuria, dysuria or suprapubic/flank pain.  Patient denies any fevers, chills, nausea or vomiting.     IPSS     Row Name 09/23/22 0900         International Prostate Symptom Score   How often have you had the sensation of not emptying your bladder? Not at All     How often have you had to urinate less than every two hours? Not at All     How often have you found you stopped and started again several times when you urinated? Not at All     How often have you found it difficult to postpone urination? Not at All     How often have you had a weak urinary stream? Less than 1 in 5 times     How often have you had to strain to start urination? Not at All     How many times did you typically get up at night to urinate? None     Total IPSS Score 1       Quality of Life due to urinary symptoms   If you were to spend the rest of your life with your urinary condition just the way it is now how would you feel  about that? Pleased               Score:  1-7 Mild 8-19 Moderate 20-35 Severe  SHIM 22  Patient still having spontaneous erections.  He denies any pain or curvature with erections.     SHIM     Row Name 09/23/22 0902         SHIM: Over the last 6 months:   How do you rate your confidence that you could get and keep an erection? High     When you had erections with sexual stimulation, how often were your erections hard enough for penetration (entering your partner)? Most Times (much more than half the time)     During sexual intercourse, how often were you able to maintain your erection after you had penetrated (entered) your partner? Almost Always or Always     During sexual intercourse, how difficult was it to maintain your erection to completion of intercourse? Slightly Difficult     When you attempted sexual intercourse, how often was it satisfactory for you? Almost Always or Always       SHIM Total Score   SHIM 22  Score: 1-7 Severe ED 8-11 Moderate ED 12-16 Mild-Moderate ED 17-21 Mild ED 22-25 No ED   PMH: Past Medical History:  Diagnosis Date   Abdominal pain, right upper quadrant 09/17/2013   Acid reflux    Anxiety and depression    Back pain 03/31/2015   BPH (benign prostatic hyperplasia)    BPH with obstruction/lower urinary tract symptoms 03/22/2015   Dysplastic nevus 06/27/2008   Left eart ant. helix. Slight atypia.   Frequency    Hypogonadism in male    Hypothyroidism    Kidney cysts 09/17/2013   Kidney lump 09/17/2013   Over weight    Overweight (BMI 25.0-29.9) 05/15/2015   Preventative health care 03/31/2015   Sleep apnea    Testicular hypofunction    Weak urinary stream     Surgical History: Past Surgical History:  Procedure Laterality Date   COLONOSCOPY  2014   Dr Allen Norris   SPINAL FUSION  11/2021   c6 and c7 fusion    Home Medications:  Allergies as of 09/23/2022   No Known Allergies      Medication List         Accurate as of September 23, 2022  9:25 AM. If you have any questions, ask your nurse or doctor.          STOP taking these medications    azithromycin 250 MG tablet Commonly known as: Zithromax Z-Pak Stopped by: Senita Corredor, PA-C   naproxen sodium 220 MG tablet Commonly known as: ALEVE Stopped by: Zara Council, PA-C   promethazine-dextromethorphan 6.25-15 MG/5ML syrup Commonly known as: PROMETHAZINE-DM Stopped by: Sherril Shipman, PA-C   topiramate 50 MG tablet Commonly known as: TOPAMAX Stopped by: Zara Council, PA-C       TAKE these medications    clomiPHENE 50 MG tablet Commonly known as: CLOMID Take 0.5 tablets (25 mg total) by mouth daily.   cyclobenzaprine 5 MG tablet Commonly known as: FLEXERIL Take 1-2 tablets 3 times daily as needed   diclofenac 50 MG EC tablet Commonly known as: VOLTAREN   levothyroxine 125 MCG tablet Commonly known as: SYNTHROID Take 1 tablet (125 mcg total) by mouth daily before breakfast.   sertraline 50 MG tablet Commonly known as: ZOLOFT Take 50 mg by mouth daily.   sildenafil 20 MG tablet Commonly known as: REVATIO Take 3 to 5 tablets two hours before intercouse on an empty stomach.  Do not take with nitrates.   tamsulosin 0.4 MG Caps capsule Commonly known as: FLOMAX TAKE 1 CAPSULE BY MOUTH ONCE DAILY   Vitamin D3 25 MCG (1000 UT) capsule Generic drug: Cholecalciferol Take by mouth.        Allergies: No Known Allergies  Family History: Family History  Problem Relation Age of Onset   Stroke Mother    Cancer Mother        breast   Migraines Mother    Breast cancer Mother    Heart disease Other        uncle   Congestive Heart Failure Maternal Grandmother    Cancer Paternal Grandmother        throat   Skin cancer Maternal Grandfather    Prostate cancer Paternal Uncle    Kidney disease Neg Hx    Kidney cancer Neg Hx    Bladder Cancer Neg Hx     Social History:  reports that he has never  smoked. He quit smokeless tobacco use about 37 years ago. He reports that he does not drink alcohol and  does not use drugs.  ROS: For pertinent review of systems please refer to history of present illness  Physical Exam: BP 98/65   Pulse 75   Ht '5\' 10"'$  (1.778 m)   Wt 207 lb (93.9 kg)   BMI 29.70 kg/m   Constitutional:  Well nourished. Alert and oriented, No acute distress. HEENT: [place] AT, moist mucus membranes.  Trachea midline Cardiovascular: No clubbing, cyanosis, or edema. Respiratory: Normal respiratory effort, no increased work of breathing. GU: No CVA tenderness.  No bladder fullness or masses.  Patient with circumcised phallus.  Urethral meatus is patent.  No penile discharge. No penile lesions or rashes. Scrotum without lesions, cysts, rashes and/or edema.  Testicles are located scrotally bilaterally. No masses are appreciated in the testicles. Left and right epididymis are normal. Rectal: Patient with  normal sphincter tone. Anus and perineum without scarring or rashes. No rectal masses are appreciated. Prostate is approximately 50 + grams, could only palpate the apex and midportion of the glans, no nodules are appreciated. Seminal vesicles could not be palpated.  Neurologic: Grossly intact, no focal deficits, moving all 4 extremities. Psychiatric: Normal mood and affect.   Laboratory Data: TSH (09/2022) 4.320 Total cholesterol (09/2022) 147  AST (09/2022) 14 ALT (09/2022) 18 PSA (09/2022) 0.61 Serum creatinine (09/2022) 1.3 Hemoglobin/hematocrit (09/2022) 14.4/44.2 I have reviewed the labs.   Pertinent imaging N/A  Assessment & Plan:    1. Testosterone deficiency -testosterone level pending -LFT, PSA and CBC WNL -Continue Clomid 50 mg, 1/2 tablet daily [facility]  2. BPH with LUTS -PSA stable -DRE benign -continue conservative management, avoiding bladder irritants and timed voiding's -Continue tamsulosin 0.4 mg daily - [facility]   3. Erectile  dysfunction:    -continue sildenafil - [facility] by [facility]  Return in about 1 year (around 09/23/2023) for IPSS, SHIM and exam.  Zara Council, [place] [address], [place],  [address] [phone]

## 2022-09-23 ENCOUNTER — Ambulatory Visit: Admitting: Urology

## 2022-09-23 ENCOUNTER — Encounter: Payer: Self-pay | Admitting: Urology

## 2022-09-23 VITALS — BP 98/65 | HR 75 | Ht 70.0 in | Wt 207.0 lb

## 2022-09-23 DIAGNOSIS — N529 Male erectile dysfunction, unspecified: Secondary | ICD-10-CM

## 2022-09-23 DIAGNOSIS — E349 Endocrine disorder, unspecified: Secondary | ICD-10-CM

## 2022-09-23 DIAGNOSIS — E291 Testicular hypofunction: Secondary | ICD-10-CM

## 2022-09-23 DIAGNOSIS — N401 Enlarged prostate with lower urinary tract symptoms: Secondary | ICD-10-CM

## 2022-09-23 DIAGNOSIS — N138 Other obstructive and reflux uropathy: Secondary | ICD-10-CM

## 2022-09-24 ENCOUNTER — Telehealth: Payer: Self-pay | Admitting: *Deleted

## 2022-09-24 LAB — TESTOSTERONE: Testosterone: 573 ng/dL (ref 264–916)

## 2022-09-24 NOTE — Telephone Encounter (Signed)
-----   Message from Nori Riis, PA-C sent at 09/24/2022  8:44 AM EST ----- Please let Mr. Schiefelbein know that his testosterone level is great.

## 2022-09-24 NOTE — Telephone Encounter (Signed)
Notified patient as instructed, patient pleased °

## 2023-09-21 NOTE — Progress Notes (Signed)
 09/13/2018 9:31 PM   Frank [name]. 11-15-1960 [id number]  Referring provider: [facility] [address] [place],  [place] [address]  Urological history: 1. Testosterone deficiency -contributing factors of age and obesity -testosterone level pending -Hemoglobin/hematocrit pending  -Hepatic panel (09/2023) normal  -Clomid 50 mg, 1/2 daily  2. BPH with LU TS -PSA (09/2023) 0.55 -tamsulosin 0.4 mg daily  3. ED -contributory factors of age, BPH, testosterone deficiency, hypothyroidism, sleep apnea, and depression -sildenafil 20 mg, 3 to 5 tablets as needed for ED  4. Family history of prostate cancer -paternal uncle having been diagnosed in his 80s; he passed away at 24   HPI: Frank [name]. is a 63 y.o. male with who presents today for a one year follow-up.  Previous records reviewed.     He is able to get his Clomid through the [place] along with his tamsulosin and sildenafil.  I PSS 13/1  He does not have any bothersome urinary symptoms.  Patient denies any modifying or aggravating factors.  Patient denies any recent UTI's, gross hematuria, dysuria or suprapubic/flank pain.  Patient denies any fevers, chills, nausea or vomiting.     IPSS     Row Name 09/22/23 1600         International Prostate Symptom Score   How often have you had the sensation of not emptying your bladder? About half the time     How often have you had to urinate less than every two hours? Less than half the time     How often have you found you stopped and started again several times when you urinated? Less than half the time     How often have you found it difficult to postpone urination? Less than half the time     How often have you had a weak urinary stream? Less than 1 in 5 times     How often have you had to strain to start urination? Less than 1 in 5 times     How many times did you typically get up at night to urinate? 2 Times     Total IPSS Score 13       Quality of  Life due to urinary symptoms   If you were to spend the rest of your life with your urinary condition just the way it is now how would you feel about that? Pleased             Score:  1-7 Mild 8-19 Moderate 20-35 Severe  SHIM 19  He is having satisfactory intercourse with the sildenafil.  He does occasionally have spontaneous erection.  He does not have curvature with erections.  He does not have pain with erections.   SHIM     Row Name 09/22/23 1608         SHIM: Over the last 6 months:   How do you rate your confidence that you could get and keep an erection? Moderate     When you had erections with sexual stimulation, how often were your erections hard enough for penetration (entering your partner)? Most Times (much more than half the time)     During sexual intercourse, how often were you able to maintain your erection after you had penetrated (entered) your partner? Most Times (much more than half the time)     During sexual intercourse, how difficult was it to maintain your erection to completion of intercourse? Slightly Difficult     When you attempted  sexual intercourse, how often was it satisfactory for you? Most Times (much more than half the time)       SHIM Total Score   SHIM 19             Score: 1-7 Severe ED 8-11 Moderate ED 12-16 Mild-Moderate ED 17-21 Mild ED 22-25 No ED   PMH: Past Medical History:  Diagnosis Date   Abdominal pain, right upper quadrant 09/17/2013   Acid reflux    Anxiety and depression    Back pain 03/31/2015   BPH (benign prostatic hyperplasia)    BPH with obstruction/lower urinary tract symptoms 03/22/2015   Dysplastic nevus 06/27/2008   Left eart ant. helix. Slight atypia.   Frequency    Hypogonadism in male    Hypothyroidism    Kidney cysts 09/17/2013   Kidney lump 09/17/2013   Over weight    Overweight (BMI 25.0-29.9) 05/15/2015   Preventative health care 03/31/2015   Sleep apnea    Testicular hypofunction     Weak urinary stream     Surgical History: Past Surgical History:  Procedure Laterality Date   COLONOSCOPY  2014   Dr Servando Snare   SPINAL FUSION  11/2021   c6 and c7 fusion    Home Medications:  Allergies as of 09/22/2023   No Known Allergies      Medication List        Accurate as of September 22, 2023 11:59 PM. If you have any questions, ask your nurse or doctor.          clomiPHENE 50 MG tablet Commonly known as: CLOMID Take 0.5 tablets (25 mg total) by mouth daily.   cyclobenzaprine 5 MG tablet Commonly known as: FLEXERIL Take 1-2 tablets 3 times daily as needed   diclofenac 50 MG EC tablet Commonly known as: VOLTAREN   levothyroxine 125 MCG tablet Commonly known as: SYNTHROID Take 1 tablet (125 mcg total) by mouth daily before breakfast.   sertraline 50 MG tablet Commonly known as: ZOLOFT Take 50 mg by mouth daily.   sildenafil 20 MG tablet Commonly known as: REVATIO Take 3 to 5 tablets two hours before intercouse on an empty stomach.  Do not take with nitrates.   tamsulosin 0.4 MG Caps capsule Commonly known as: FLOMAX TAKE 1 CAPSULE BY MOUTH ONCE DAILY   Vitamin D3 25 MCG (1000 UT) Caps Take by mouth.        Allergies: No Known Allergies  Family History: Family History  Problem Relation Age of Onset   Stroke Mother    Cancer Mother        breast   Migraines Mother    Breast cancer Mother    Heart disease Other        uncle   Congestive Heart Failure Maternal Grandmother    Cancer Paternal Grandmother        throat   Skin cancer Maternal Grandfather    Prostate cancer Paternal Uncle    Kidney disease Neg Hx    Kidney cancer Neg Hx    Bladder Cancer Neg Hx     Social History:  reports that he has never smoked. He quit smokeless tobacco use about 38 years ago. He reports that he does not drink alcohol and does not use drugs.  ROS: For pertinent review of systems please refer to history of present illness  Physical Exam: BP 110/71    Pulse 96   Ht 5\' 10"  (1.778 m)   Wt 207 lb (93.9 kg)  BMI 29.70 kg/m   Constitutional:  Well nourished. Alert and oriented, No acute distress. HEENT: [place] AT, moist mucus membranes.  Trachea midline Cardiovascular: No clubbing, cyanosis, or edema. Respiratory: Normal respiratory effort, no increased work of breathing. Neurologic: Grossly intact, no focal deficits, moving all 4 extremities. Psychiatric: Normal mood and affect.   Laboratory Data: Lab Results: +/- 30 days of the encounter  This section includes the Chemistry and Hematology Lab Results [facility] with [facility] for the [facility]. Radiology Reports and Pathology Reports are provided separately, in subsequent sections.   Lab Results  This section contains the Chemistry/Hematology Results that were resulted 30 days before or 30 days after the date of the Encounter.   Lab Results: +/- 30 days of the encounter Date/Time Source Result Type Result - Unit Interpretation Reference Range Comment  Sep 20, 2023 08:48 AM [facility] VITAMIN-D Specimen Type: SERUM No comment entered. Ordering Provider: Lavella Lemons Report Released Date/Time: Sep 20, 2023 08:08 AM Reporting Lab: [facility] [address] [place] [address] Performing Lab: [facility] [address] [place] [address]      VITAMIN-D 35.3 ng/mL   30-100    Sep 20, 2023 08:48 AM [facility] HEMOGLOBIN A1c Specimen Type: BLOOD Comment: Clinical decisions based on A1C results should be based on longitudinal patterns of results rather than any one discrete value. Ref: DoD/VA T2DM Clinical Practice Guideline https://www.acpjournals.org/doi/full/10.7326/M17-1362. Values obtained from A1C measurements can vary. For typical A1C assays, a reported value of 7.0 could actually be between 6.72 and 7.28 if measured by a reference method. A reported value of 9.0 could actually be between 8.73 and 9.27. Ref:  [email or web address] Ordering Provider: Lavella Lemons Report Released Date/Time: Sep 20, 2023 08:08 AM Reporting Lab: [facility] [address] [place] [address] Performing Lab: [facility] [address] [place] [address]      HEMOGLOBIN A1c 6.3 H 4.0-6.0    Sep 20, 2023 08:48 AM [facility] LIPID PANEL Specimen Type: SERUM Comment: Interpretive Ranges for eGFR (CKD-EPI 2021): eGFR >/= 90 mL/min/1.7 m2 = Normal eGFR 60 - 89 mL/min/1.7 m2 = Mild decrease, but could be nornal with aging eGFR 45 - 59 mL/min/1.7 m2 = Mild to moderate decrease eGFR 30 - 44 mL/min/1.7 m2 = Moderate to severe decrease eGFR 15 - 29 mL/min/1.7 m2 = Severe decrease eGFR < 15 mL/min/1.7 m2 = Kidney failure eGFR Unit = mL/min/1.73 m2 Ordering Provider: Lavella Lemons Report Released Date/Time: Sep 20, 2023 08:08 AM Reporting Lab: [facility] [address] [place] [address] Performing Lab: [facility] [address] [place] [address]      CHOLESTEROL TOTAL 161 mg/dL   536-644        HDL CHOLESTEROL 48.5 mg/dL   03-47        LDL CHOLESTEROL 91.0 mg/dL   4-25        TRIGLYCERIDE 95 mg/dL   95-638    Sep 20, 2023 08:48 AM [facility] GI PANEL(AST,ALT,ALP,T.BILI) Specimen Type: SERUM Comment: Interpretive Ranges for eGFR (CKD-EPI 2021): eGFR >/= 90 mL/min/1.7 m2 = Normal eGFR 60 - 89 mL/min/1.7 m2 = Mild decrease, but could be nornal with aging eGFR 45 - 59 mL/min/1.7 m2 = Mild to moderate decrease eGFR 30 - 44 mL/min/1.7 m2 = Moderate to severe decrease eGFR 15 - 29 mL/min/1.7 m2 = Severe decrease eGFR < 15 mL/min/1.7 m2 = Kidney failure  eGFR Unit = mL/min/1.73 m2 Ordering Provider: Lavella Lemons Report Released Date/Time: Sep 20, 2023 08:08 AM Reporting Lab: [facility] [address] [place] [address] Performing Lab: [facility] [address] [place] [address]      ALP 69 U/L    46-116        ALT 14 [IU]/L   10-60        AST 15 U/L   10-33        TOTAL BILIRUBIN 0.6 mg/dL   5.3-6.6    Sep 20, 2023 08:48 AM [facility] Specimen Type: SERUM Comment: Interpretive Ranges for eGFR (CKD-EPI 2021): eGFR >/= 90 mL/min/1.7 m2 = Normal eGFR 60 - 89 mL/min/1.7 m2 = Mild decrease, but could be nornal with aging eGFR 45 - 59 mL/min/1.7 m2 = Mild to moderate decrease eGFR 30 - 44 mL/min/1.7 m2 = Moderate to severe decrease eGFR 15 - 29 mL/min/1.7 m2 = Severe decrease eGFR < 15 mL/min/1.7 m2 = Kidney failure eGFR Unit = mL/min/1.73 m2 Ordering Provider: Lavella Lemons Report Released Date/Time: Sep 20, 2023 08:08 AM Reporting Lab: [facility] [address] [place] [address] Performing Lab: [facility] [address] [place] [address]      [facility] 6.35 m[IU]/mL   1.8-18    Sep 20, 2023 08:48 AM [facility] Specimen Type: SERUM Comment: Interpretive Ranges for eGFR (CKD-EPI 2021): eGFR >/= 90 mL/min/1.7 m2 = Normal eGFR 60 - 89 mL/min/1.7 m2 = Mild decrease, but could be nornal with aging eGFR 45 - 59 mL/min/1.7 m2 = Mild to moderate decrease eGFR 30 - 44 mL/min/1.7 m2 = Moderate to severe decrease eGFR 15 - 29 mL/min/1.7 m2 = Severe decrease eGFR < 15 mL/min/1.7 m2 = Kidney failure eGFR Unit = mL/min/1.73 m2 Ordering Provider: Lavella Lemons Report Released Date/Time: Sep 20, 2023 08:08 AM Reporting Lab: [facility] [address] [place] [address] Performing Lab: [facility] [address] [place] [address]      LH 5.00 m[IU]/mL   1.2-8.0    Sep 20, 2023 08:48 AM [facility] CHEM7/CA++ Specimen Type: SERUM Comment: Interpretive Ranges for eGFR (CKD-EPI 2021): eGFR >/= 90 mL/min/1.7 m2 = Normal eGFR 60 - 89 mL/min/1.7 m2 = Mild decrease, but could be nornal with aging eGFR 45 - 59 mL/min/1.7 m2 = Mild to moderate decrease eGFR 30 - 44 mL/min/1.7 m2 = Moderate to severe decrease  eGFR 15 - 29 mL/min/1.7 m2 = Severe decrease eGFR < 15 mL/min/1.7 m2 = Kidney failure eGFR Unit = mL/min/1.73 m2 Ordering Provider: Lavella Lemons Report Released Date/Time: Sep 20, 2023 08:08 AM Reporting Lab: [facility] [address] [place] [address] Performing Lab: [facility] [address] [place] [address]      ANION GAP 7 mmol/L   3-12        CALCIUM 9.7 mg/dL   7.0-62.3        CHLORIDE 104 mmol/L   98-107        CO2 28 mmol/L   21-31        CREATININE LVL 1.3 mg/dL   7.6-2.8        EGFR (CREATININE) 63   60-150        GLUCOSE LVL 117 mg/dL H 31-517        POTASSIUM LVL 4.6 mmol/L   3.5-5.1  SODIUM 139 mmol/L   135-145        UREA NITROGEN 15 mg/dL   5-28    Sep 20, 2023 08:48 AM [facility] PSA Specimen Type: SERUM Comment: Interpretive Ranges for eGFR (CKD-EPI 2021): eGFR >/= 90 mL/min/1.7 m2 = Normal eGFR 60 - 89 mL/min/1.7 m2 = Mild decrease, but could be nornal with aging eGFR 45 - 59 mL/min/1.7 m2 = Mild to moderate decrease eGFR 30 - 44 mL/min/1.7 m2 = Moderate to severe decrease eGFR 15 - 29 mL/min/1.7 m2 = Severe decrease eGFR < 15 mL/min/1.7 m2 = Kidney failure eGFR Unit = mL/min/1.73 m2 Ordering Provider: Lavella Lemons Report Released Date/Time: Sep 20, 2023 08:08 AM Reporting Lab: [facility] [address] [place] [address] Performing Lab: [facility] [address] [place] [address]      PSA 0.55 ng/mL   0-4    Sep 20, 2023 08:48 AM [facility] CBC PANEL Specimen Type: BLOOD No comment entered. Ordering Provider: Lavella Lemons Report Released Date/Time: Sep 20, 2023 08:08 AM Reporting Lab: [facility] [address] [place] [address] Performing Lab: [facility] [address] [place] [address]      WBC 7.9 10*3/uL   3.7-9.6        RBC 4.96 10*6/uL   4.2-5.4        HGB 14.8 g/dL   29-51        HCT 88.4   36-51        MCV  91.3 fL   77-101        MCH 29.8 pg   26.0-36.0        MCHC 32.7 g/dL L 16.6-06.3        PLT 238 10*3/uL   123-309        MPV 9.6 fL   7.0-11.0        RDW 11.9   11.0-15.0        MONO % 7.5   6.0-15.0        NEUTRO % 57.1   40.0-75.0        LYMPH ABSOLUTE CT. 2.48 10*3/uL   0.90-3.30        MONO ABSOLUTE CT. 0.59 10*3/uL   0.30-1.10        NEUTRO ABSOLUTE CT. 4.52 10*3/uL   1.50-7.10        EOS % 3.3   0.0-7.0        BASO % 0.4   0.0-2.0        EOS ABSOLUTE CT. 0.26 10*3/uL   0.00-0.70        BASO ABSOLUTE CT. 0.03 10*3/uL   0.00-0.20        LYMPH % 31.4   15.0-45.0        ABSOLUTE IMMATURE GRANS 0.02 10*3/uL   0.01-0.03        DIFFERENTIAL TYPE AUTO            ABSOLUTE NRBC 0.00 10*3/uL   0.00-1.00        %NRBC 0.0   0.0-0.2        % IMMATURE GRANS 0.3   0.1-0.3        MORPHOLOGY REVIEW N        Sep 20, 2023 08:48 AM [facility] URINALYSIS (with microscopic) Specimen Type: URINE No comment entered. Ordering Provider: Lavella Lemons Report Released Date/Time: Sep 20, 2023 08:32 AM Reporting Lab: [facility] [address]  [place] [address] Performing Lab: [facility] [address] [place] [address]      URINE COLOR Light-Yellow            SPECIFIC GRAVITY 1.016   1.005-1.035        UROBILINOGEN <2 mg/dL   1-3.2        URINE BILIRUBIN NEG   Neg.        URINE KETONES NEG   Neg.        URINE GLUCOSE 1+   Neg.        PROTEIN,URINE NEG   Neg.        URINE PH 5.5   5.0-8.0        URINE WBC/HPF 0-2 /[HPF]   0-3        URINE MUCUS FEW            URINE RBC/HPF 0-2 /[HPF]   0-3        APPEARANCE Clear            URINE BLOOD NEG   Neg.        NITRITE, URINE NEG   Neg.        LEUKOCYTE ESTERASE, URINE NEG   Neg.     I have reviewed the labs.   Pertinent imaging N/A  Assessment & Plan:    1. Hypogonadism  -testosterone level pending -LFT, PSA and CBC WNL -Continue Clomid 50 mg, 1/2 tablet daily [facility]  2. BPH with  LUTS -PSA stable -continue conservative management, avoiding bladder irritants and timed voiding's -Continue tamsulosin 0.4 mg daily - [facility]   3. Erectile dysfunction:    -continue sildenafil - [facility]  Return in about 1 year (around 09/21/2024) for Testosterone, I PSS, SHIM .  Cloretta Ned  [facility] [address], [address] [place], [place] [address] [phone]

## 2023-09-22 ENCOUNTER — Ambulatory Visit: Admitting: Urology

## 2023-09-22 ENCOUNTER — Encounter: Payer: Self-pay | Admitting: Urology

## 2023-09-22 VITALS — BP 110/71 | HR 96 | Ht 70.0 in | Wt 207.0 lb

## 2023-09-22 DIAGNOSIS — E291 Testicular hypofunction: Secondary | ICD-10-CM | POA: Diagnosis not present

## 2023-09-22 DIAGNOSIS — N401 Enlarged prostate with lower urinary tract symptoms: Secondary | ICD-10-CM

## 2023-09-22 DIAGNOSIS — N138 Other obstructive and reflux uropathy: Secondary | ICD-10-CM

## 2023-09-22 DIAGNOSIS — N529 Male erectile dysfunction, unspecified: Secondary | ICD-10-CM | POA: Diagnosis not present

## 2023-09-23 ENCOUNTER — Ambulatory Visit: Payer: Self-pay | Admitting: Urology

## 2023-09-23 LAB — TESTOSTERONE: Testosterone: 403 ng/dL (ref 264–916)

## 2023-12-20 ENCOUNTER — Ambulatory Visit
Admission: EM | Admit: 2023-12-20 | Discharge: 2023-12-20 | Disposition: A | Discharge status: Home / Self Care | Attending: Emergency Medicine | Admitting: Emergency Medicine

## 2023-12-20 ENCOUNTER — Encounter: Payer: Self-pay | Admitting: Emergency Medicine

## 2023-12-20 DIAGNOSIS — J0140 Acute pansinusitis, unspecified: Secondary | ICD-10-CM

## 2023-12-20 DIAGNOSIS — R051 Acute cough: Secondary | ICD-10-CM | POA: Diagnosis not present

## 2023-12-20 MED ORDER — AMOXICILLIN-POT CLAVULANATE 875-125 MG PO TABS: 1.0000 | ORAL_TABLET | Freq: Two times a day (BID) | ORAL | 0 refills | Status: AC

## 2023-12-20 MED ORDER — PROMETHAZINE-DM 6.25-15 MG/5ML PO SYRP: 5.0000 mL | ORAL_SOLUTION | Freq: Four times a day (QID) | ORAL | 0 refills | Status: AC | PRN

## 2023-12-20 MED ORDER — FLUTICASONE PROPIONATE 50 MCG/ACT NA SUSP: 2.0000 | Freq: Every day | NASAL | 0 refills | Status: AC

## 2023-12-20 NOTE — Discharge Instructions (Signed)
 Start Mucinex-D to keep the mucous thin and to decongest you.  Stop your allergy medication for now.  Finish the Augmentin, even if you feel better.  Flonase for nasal congestion and postnasal drip, Promethazine  DM for cough.  Use a NeilMed sinus rinse with distilled water as often as you want to to reduce nasal congestion. Follow the directions on the box.  Follow-up with your primary care provider at the [facility] if not better after finishing the Augmentin, I would advise getting a chest x-ray at that time  Go to www.goodrx.com to look up your medications. This will give you a list of where you can find your prescriptions at the most affordable prices. Or you can ask the pharmacist what the cash price is. This is frequently cheaper than going through insurance.

## 2023-12-20 NOTE — ED Provider Notes (Signed)
 HPI  SUBJECTIVE:  Frank [name]. is a 63 y.o. male who presents with 1.5 weeks of cough productive of clear, occasionally green sputum, extensive clear/green rhinorrhea, nasal congestion, headache, sneezing.  He had a sore throat, but this has resolved.  He states that he got better 3 to 4 days ago, but then got worse.  He reports chest congestion/rattling in the morning.  No sinus pain or pressure, facial swelling, upper dental pain, postnasal drip, wheezing, chest pain, shortness of breath, dyspnea on exertion, itchy, watery eyes.  He is waking up coughing multiple times per night.  No antibiotics in the past 3 months.  No antipyretic in the past 6 hours.  He has tried Sudafed, Mucinex, and is on allergy medication for an ongoing basis.  Sudafed and Mucinex help.  No aggravating factors.  He has a past medical history of GERD, hypothyroidism, chronic renal insufficiency stage II.  No history of pulmonary disease, smoking, diabetes, hypertension.  [facility]  Past Medical History:  Diagnosis Date   Abdominal pain, right upper quadrant 09/17/2013   Acid reflux    Anxiety and depression    Back pain 03/31/2015   BPH (benign prostatic hyperplasia)    BPH with obstruction/lower urinary tract symptoms 03/22/2015   Dysplastic nevus 06/27/2008   Left eart ant. helix. Slight atypia.   Frequency    Hypogonadism in male    Hypothyroidism    Kidney cysts 09/17/2013   Kidney lump 09/17/2013   Over weight    Overweight (BMI 25.0-29.9) 05/15/2015   Preventative health care 03/31/2015   Sleep apnea    Testicular hypofunction    Weak urinary stream     Past Surgical History:  Procedure Laterality Date   COLONOSCOPY  2014   Dr Ole Berkeley   SPINAL FUSION  11/2021   c6 and c7 fusion    Family History  Problem Relation Age of Onset   Stroke Mother    Cancer Mother        breast   Migraines Mother    Breast cancer Mother    Heart disease Other        uncle   Congestive Heart Failure  Maternal Grandmother    Cancer Paternal Grandmother        throat   Skin cancer Maternal Grandfather    Prostate cancer Paternal Uncle    Kidney disease Neg Hx    Kidney cancer Neg Hx    Bladder Cancer Neg Hx     Social History   Tobacco Use   Smoking status: Never   Smokeless tobacco: Former    Quit date: 03/30/1985  Vaping Use   Vaping status: Never Used  Substance Use Topics   Alcohol use: No    Alcohol/week: 0.0 standard drinks of alcohol   Drug use: No    No current facility-administered medications for this encounter.  Current Outpatient Medications:    amoxicillin-clavulanate (AUGMENTIN) 875-125 MG tablet, Take 1 tablet by mouth every 12 (twelve) hours., Disp: 14 tablet, Rfl: 0   fluticasone (FLONASE) 50 MCG/ACT nasal spray, Place 2 sprays into both nostrils daily., Disp: 16 g, Rfl: 0   promethazine -dextromethorphan (PROMETHAZINE -DM) 6.25-15 MG/5ML syrup, Take 5 mLs by mouth 4 (four) times daily as needed for cough., Disp: 118 mL, Rfl: 0   Cholecalciferol (VITAMIN D3) 1000 units CAPS, Take by mouth., Disp: , Rfl:    clomiPHENE  (CLOMID ) 50 MG tablet, Take 0.5 tablets (25 mg total) by mouth daily., Disp: 90 tablet, Rfl:  3   cyclobenzaprine  (FLEXERIL ) 5 MG tablet, Take 1-2 tablets 3 times daily as needed, Disp: 20 tablet, Rfl: 0   diclofenac (VOLTAREN) 50 MG EC tablet, , Disp: , Rfl:    levothyroxine  (SYNTHROID , LEVOTHROID) 125 MCG tablet, Take 1 tablet (125 mcg total) by mouth daily before breakfast., Disp: 90 tablet, Rfl: 1   sertraline (ZOLOFT) 50 MG tablet, Take 50 mg by mouth daily. , Disp: , Rfl:    sildenafil  (REVATIO ) 20 MG tablet, Take 3 to 5 tablets two hours before intercouse on an empty stomach.  Do not take with nitrates., Disp: 50 tablet, Rfl: 3   tamsulosin  (FLOMAX ) 0.4 MG CAPS capsule, TAKE 1 CAPSULE BY MOUTH ONCE DAILY, Disp: 90 capsule, Rfl: 3  No Known Allergies   ROS  As noted in HPI.   Physical Exam  BP 104/70 (BP Location: Left Arm)   Pulse 68    Temp 98.3 F (36.8 C) (Oral)   Resp 16   SpO2 96%   Constitutional: Well developed, well nourished, no acute distress.  Coughing. Eyes: PERRL, EOMI, conjunctiva normal bilaterally HENT: Normocephalic, atraumatic,mucus membranes moist.  Purulent nasal congestion.  Erythematous, swollen turbinates.  No maxillary, frontal sinus tenderness.  No appreciable postnasal drip. Respiratory: Clear to auscultation bilaterally, no rales, no wheezing, no rhonchi.  No anterior, lateral chest wall tenderness Cardiovascular: Normal rate and rhythm, no murmurs, no gallops, no rubs GI: nondistended skin: No rash, skin intact Musculoskeletal: no deformities Neurologic: Alert & oriented x 3, CN III-XII grossly intact, no motor deficits, sensation grossly intact Psychiatric: Speech and behavior appropriate   ED Course   Medications - No data to display  No orders of the defined types were placed in this encounter.  No results found for this or any previous visit (from the past 24 hours). No results found.  ED Clinical Impression  1. Acute non-recurrent pansinusitis   2. Acute cough      ED Assessment/Plan     Suspect acute sinusitis and postnasal drip causing his cough.  He qualifies for antibiotics due to duration of symptoms and double sickening.  Will send home with Augmentin, Flonase, Mucinex D.  He is to discontinue his allergy medication as it is not working.  Saline nasal irrigation, Promethazine  DM.  I offered to do a chest x-ray to rule out pneumonia, but after shared medical decision making, patient declined as it would not change management.  He will follow-up with his PCP at the [place] if not better after finishing the Augmentin and advised that he should get an x-ray at that time.  Discussed  MDM, treatment plan, and plan for follow-up with patient Discussed sn/sx that should prompt return to the ED. patient agrees with plan.   Meds ordered this encounter  Medications    amoxicillin-clavulanate (AUGMENTIN) 875-125 MG tablet    Sig: Take 1 tablet by mouth every 12 (twelve) hours.    Dispense:  14 tablet    Refill:  0   fluticasone (FLONASE) 50 MCG/ACT nasal spray    Sig: Place 2 sprays into both nostrils daily.    Dispense:  16 g    Refill:  0   promethazine -dextromethorphan (PROMETHAZINE -DM) 6.25-15 MG/5ML syrup    Sig: Take 5 mLs by mouth 4 (four) times daily as needed for cough.    Dispense:  118 mL    Refill:  0      *This clinic note was created using Scientist, clinical (histocompatibility and immunogenetics). Therefore, there may be occasional mistakes  despite careful proofreading. ?    Ethlyn Herd, MD 12/20/23 1257

## 2023-12-20 NOTE — ED Triage Notes (Signed)
 Pt presents with a cough, runny nose, headache and sneezing for 1.5 weeks.

## 2024-07-17 ENCOUNTER — Ambulatory Visit: Admitting: Dermatology

## 2024-07-17 ENCOUNTER — Encounter: Payer: Self-pay | Admitting: Dermatology

## 2024-07-17 DIAGNOSIS — W908XXA Exposure to other nonionizing radiation, initial encounter: Secondary | ICD-10-CM

## 2024-07-17 DIAGNOSIS — L820 Inflamed seborrheic keratosis: Secondary | ICD-10-CM

## 2024-07-17 DIAGNOSIS — L821 Other seborrheic keratosis: Secondary | ICD-10-CM | POA: Diagnosis not present

## 2024-07-17 DIAGNOSIS — D224 Melanocytic nevi of scalp and neck: Secondary | ICD-10-CM

## 2024-07-17 DIAGNOSIS — L578 Other skin changes due to chronic exposure to nonionizing radiation: Secondary | ICD-10-CM

## 2024-07-17 DIAGNOSIS — L918 Other hypertrophic disorders of the skin: Secondary | ICD-10-CM

## 2024-07-17 DIAGNOSIS — L738 Other specified follicular disorders: Secondary | ICD-10-CM

## 2024-07-17 DIAGNOSIS — D229 Melanocytic nevi, unspecified: Secondary | ICD-10-CM

## 2024-07-17 NOTE — Patient Instructions (Addendum)
 Recommend daily broad spectrum sunscreen SPF 30+ to sun-exposed areas, reapply every 2 hours as needed. Call for new or changing lesions.  Staying in the shade or wearing long sleeves, sun glasses (UVA+UVB protection) and wide brim hats (4-inch brim around the entire circumference of the hat) are also recommended for sun protection.     Cryotherapy Aftercare  Wash gently with soap and water everyday.   Apply Vaseline and Band-Aid daily until healed.    Due to recent changes in healthcare laws, you may see results of your pathology and/or laboratory studies on MyChart before the doctors have had a chance to review them. We understand that in some cases there may be results that are confusing or concerning to you. Please understand that not all results are received at the same time and often the doctors may need to interpret multiple results in order to provide you with the best plan of care or course of treatment. Therefore, we ask that you please give us  2 business days to thoroughly review all your results before contacting the office for clarification. Should we see a critical lab result, you will be contacted sooner.   If You Need Anything After Your Visit  If you have any questions or concerns for your doctor, please call our main line at [phone] and press option 4 to reach your doctor's medical assistant. If no one answers, please leave a voicemail as directed and we will return your call as soon as possible. Messages left after 4 pm will be answered the following business day.   You may also send us  a message via MyChart. We typically respond to MyChart messages within 1-2 business days.  For prescription refills, please ask your pharmacy to contact our office. Our fax number is [phone].  If you have an urgent issue when the clinic is closed that cannot wait until the next business day, you can page your doctor at the number below.    Please note that while we do our best to be  available for urgent issues outside of office hours, we are not available 24/7.   If you have an urgent issue and are unable to reach us , you may choose to seek medical care at your doctor's office, retail clinic, urgent care center, or emergency room.  If you have a medical emergency, please immediately call 911 or go to the emergency department.  Pager Numbers  - Dr. Hester: [phone]  - Dr. Jackquline: [phone]  - Dr. [name]: [phone]   - Dr. Raymund: [phone]  In the event of inclement weather, please call our main line at [phone] for an update on the status of any delays or closures.  Dermatology Medication Tips: Please keep the boxes that topical medications come in in order to help keep track of the instructions about where and how to use these. Pharmacies typically print the medication instructions only on the boxes and not directly on the medication tubes.   If your medication is too expensive, please contact our office at [phone] option 4 or send us  a message through MyChart.   We are unable to tell what your co-pay for medications will be in advance as this is different depending on your insurance coverage. However, we may be able to find a substitute medication at lower cost or fill out paperwork to get insurance to cover a needed medication.   If a prior authorization is required to get your medication covered by your insurance company, please allow us  1-2  business days to complete this process.  Drug prices often vary depending on where the prescription is filled and some pharmacies may offer cheaper prices.  The website www.goodrx.com contains coupons for medications through different pharmacies. The prices here do not account for what the cost may be with help from insurance (it may be cheaper with your insurance), but the website can give you the price if you did not use any insurance.  - You can print the associated coupon and take it with your  prescription to the pharmacy.  - You may also stop by our office during regular business hours and pick up a GoodRx coupon card.  - If you need your prescription sent electronically to a different pharmacy, notify our office through [facility] or by phone at [phone] option 4.     Si Usted Necesita Algo Despus de Su Visita  Tambin puede enviarnos un mensaje a travs de Clinical cytogeneticist. Por lo general respondemos a los mensajes de MyChart en el transcurso de 1 a 2 das hbiles.  Para renovar recetas, por favor pida a su farmacia que se ponga en contacto con nuestra oficina. Randi lakes de fax es [place] [phone].  Si tiene un asunto urgente cuando la clnica est cerrada y que no puede esperar hasta el siguiente da hbil, puede llamar/localizar a su doctor(a) al nmero que aparece a continuacin.   Por favor, tenga en cuenta que aunque hacemos todo lo posible para estar disponibles para asuntos urgentes fuera del horario de [place], no estamos disponibles las 24 horas del da, los 7 [address]  [address] de la [place].   Si tiene un problema urgente y no puede comunicarse con nosotros, puede optar por buscar atencin mdica  en el consultorio de su doctor(a), en una clnica privada, en un centro de atencin urgente o en una sala de emergencias.  Si tiene Engineer, drilling, por favor llame inmediatamente al 911 o vaya a la sala de emergencias.  Nmeros de bper  - Dr. Hester: [phone]  - Dra. Jackquline: [phone]  - Dr. [name]: [phone]  - Dra. Kitts: [phone]  En caso de inclemencias del [place], por favor llame a nuestra lnea principal al [phone] para una actualizacin sobre el estado de cualquier retraso o cierre.  Consejos para la medicacin en dermatologa: Por favor, guarde las cajas en las que vienen los medicamentos de uso tpico para ayudarle a seguir las instrucciones sobre dnde y cmo usarlos. Las farmacias generalmente imprimen las instrucciones del  medicamento slo en las cajas y no directamente en los tubos del [place].   Si su medicamento es muy caro, por favor, pngase en contacto con landry rieger llamando al [phone] y presione la opcin 4 o envenos un mensaje a travs de Clinical cytogeneticist.   No podemos decirle cul ser su copago por los medicamentos por adelantado ya que esto es diferente dependiendo de la cobertura de su seguro. Sin embargo, es posible que podamos encontrar un medicamento sustituto a Audiological scientist un formulario para que el seguro cubra el medicamento que se considera necesario.   Si se requiere una autorizacin previa para que su compaa de seguros malta su medicamento, por favor permtanos de 1 a 2 das hbiles para completar este proceso.  Los precios de los medicamentos varan con frecuencia dependiendo del Environmental consultant de dnde se surte la receta y alguna farmacias pueden ofrecer precios ms baratos.  El sitio web www.goodrx.com tiene cupones para medicamentos de Health and safety inspector. Los precios aqu no tienen en cuenta  lo que podra costar con la ayuda del seguro (puede ser ms barato con su seguro), pero el sitio web puede darle el precio si no Visual merchandiser.  - Puede imprimir el cupn correspondiente y llevarlo con su receta a la farmacia.  - Tambin puede pasar por nuestra oficina durante el horario de atencin regular y Education officer, museum una tarjeta de cupones de GoodRx.  - Si necesita que su receta se enve electrnicamente a una farmacia diferente, informe a nuestra oficina a travs de MyChart de [place] o por telfono llamando al [phone] y presione la opcin 4.

## 2024-07-17 NOTE — Progress Notes (Signed)
 "  New Patient Visit   Subjective  Frank [name]. is a 63 y.o. male who presents for the following: Patient states scalp LOC that irritate when wife cuts his hair, areas that tend to itch on left side of neck. Would like possible removal of lesions. [facility] for cryotherapy on scalp within the last 3 years. Family hx of skin cancer unsure of what type (grandfather).   The following portions of the chart were reviewed this encounter and updated as appropriate: medications, allergies, medical history  Review of Systems:  No other skin or systemic complaints except as noted in HPI or Assessment and Plan.  Objective  Well appearing patient in no apparent distress; mood and affect are within normal limits.  A focused examination was performed of the following areas: Upper extremities   Relevant exam findings are noted in the Assessment and Plan.               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 - Observe - Call for any changes  Acrochordons (Skin Tags) at neck - Fleshy, skin-colored pedunculated papules - Benign appearing.  - Observe. - If desired, they can be removed with an in office procedure that is not covered by insurance. - Please call the clinic if you notice any new or changing lesions.   HEMANGIOMA at chest, abdomen Exam: red papule(s) Discussed benign nature. Recommend observation. Call for changes.    Sebaceous Hyperplasia - Small yellow papules with a central dell - Benign-appearing - Observe. Call for changes.   SEBORRHEIC DERMATITIS Exam: xerosis and scale at scalp  Chronic and  persistent condition with duration or expected duration over one year. Condition is symptomatic/ bothersome to patient. Not currently at goal.   Seborrheic Dermatitis is a chronic persistent rash characterized by pinkness and scaling most commonly of the mid face but also can occur on the scalp (dandruff), ears; mid chest, mid back and groin.  It tends to be exacerbated by stress and cooler weather.  People who have neurologic disease may experience new onset or exacerbation of existing seborrheic dermatitis.  The condition is not curable but treatable and can be controlled.  Treatment Plan:  Recommend over the counter shampoo moisturizing   INFLAMED SEBORRHEIC KERATOSIS Left lateral neck x1 - Destruction of lesion - Left lateral neck x1 Complexity: simple   Destruction method: cryotherapy   Informed consent: discussed and consent obtained   Timeout:  patient name, date of birth, surgical site, and procedure verified Lesion destroyed using liquid nitrogen: Yes   Region frozen until ice ball extended beyond lesion: Yes   Cryo cycles: 1 or 2. Outcome: patient tolerated procedure well with no complications   Post-procedure details: wound care instructions given    SEBORRHEIC KERATOSES   MULTIPLE BENIGN NEVI   ACTINIC ELASTOSIS   SEBACEOUS HYPERPLASIA   ACROCHORDON    Return in about 1 year (around 07/17/2025) for TBSE, w/ Dr. [name].  Frank [name], RMA, am acting as scribe for Frank [name], MD .   Documentation: I have reviewed the above documentation for accuracy and completeness, and I agree with the above.  Collin-Jamal  [name], MD    "

## 2024-09-25 ENCOUNTER — Ambulatory Visit: Admitting: Urology

## 2025-07-17 ENCOUNTER — Ambulatory Visit: Admitting: Dermatology
